# Patient Record
Sex: Female | Born: 1935 | Race: White | Hispanic: No | Smoking: Former smoker
Health system: Southern US, Community
[De-identification: ages and names within clinical notes are randomized; demographics above are authoritative.]

## PROBLEM LIST (undated history)

## (undated) DIAGNOSIS — H547 Unspecified visual loss: Secondary | ICD-10-CM

## (undated) DIAGNOSIS — I509 Heart failure, unspecified: Secondary | ICD-10-CM

## (undated) DIAGNOSIS — K222 Esophageal obstruction: Secondary | ICD-10-CM

## (undated) DIAGNOSIS — J449 Chronic obstructive pulmonary disease, unspecified: Secondary | ICD-10-CM

## (undated) DIAGNOSIS — Z9289 Personal history of other medical treatment: Secondary | ICD-10-CM

## (undated) DIAGNOSIS — M549 Dorsalgia, unspecified: Secondary | ICD-10-CM

## (undated) DIAGNOSIS — I714 Abdominal aortic aneurysm, without rupture, unspecified: Secondary | ICD-10-CM

## (undated) DIAGNOSIS — I6529 Occlusion and stenosis of unspecified carotid artery: Secondary | ICD-10-CM

## (undated) DIAGNOSIS — I341 Nonrheumatic mitral (valve) prolapse: Secondary | ICD-10-CM

## (undated) DIAGNOSIS — I10 Essential (primary) hypertension: Secondary | ICD-10-CM

## (undated) DIAGNOSIS — G8929 Other chronic pain: Secondary | ICD-10-CM

## (undated) DIAGNOSIS — C3490 Malignant neoplasm of unspecified part of unspecified bronchus or lung: Secondary | ICD-10-CM

## (undated) DIAGNOSIS — G4730 Sleep apnea, unspecified: Secondary | ICD-10-CM

## (undated) DIAGNOSIS — F0390 Unspecified dementia without behavioral disturbance: Secondary | ICD-10-CM

## (undated) DIAGNOSIS — I2510 Atherosclerotic heart disease of native coronary artery without angina pectoris: Secondary | ICD-10-CM

## (undated) HISTORY — DX: Occlusion and stenosis of unspecified carotid artery: I65.29

## (undated) HISTORY — DX: Essential (primary) hypertension: I10

## (undated) HISTORY — DX: Personal history of other medical treatment: Z92.89

## (undated) HISTORY — DX: Chronic obstructive pulmonary disease, unspecified: J44.9

## (undated) HISTORY — PX: FINGER SURGERY: SHX640

## (undated) HISTORY — PX: CHOLECYSTECTOMY: SHX55

## (undated) HISTORY — PX: COLONOSCOPY: SHX174

## (undated) HISTORY — PX: APPENDECTOMY: SHX54

## (undated) HISTORY — PX: OTHER SURGICAL HISTORY: SHX169

## (undated) HISTORY — DX: Abdominal aortic aneurysm, without rupture: I71.4

## (undated) HISTORY — DX: Nonrheumatic mitral (valve) prolapse: I34.1

## (undated) HISTORY — DX: Sleep apnea, unspecified: G47.30

## (undated) HISTORY — DX: Heart failure, unspecified: I50.9

## (undated) HISTORY — DX: Malignant neoplasm of unspecified part of unspecified bronchus or lung: C34.90

## (undated) HISTORY — DX: Esophageal obstruction: K22.2

## (undated) HISTORY — DX: Dorsalgia, unspecified: M54.9

## (undated) HISTORY — PX: TOTAL ABDOMINAL HYSTERECTOMY W/ BILATERAL SALPINGOOPHORECTOMY: SHX83

## (undated) HISTORY — PX: BRAIN SURGERY: SHX531

## (undated) HISTORY — DX: Other chronic pain: G89.29

## (undated) HISTORY — DX: Unspecified visual loss: H54.7

## (undated) HISTORY — DX: Atherosclerotic heart disease of native coronary artery without angina pectoris: I25.10

## (undated) HISTORY — DX: Abdominal aortic aneurysm, without rupture, unspecified: I71.40

---

## 2005-03-05 ENCOUNTER — Ambulatory Visit: Payer: Self-pay | Admitting: Ophthalmology

## 2005-03-21 ENCOUNTER — Emergency Department: Payer: Self-pay | Admitting: Emergency Medicine

## 2005-04-09 ENCOUNTER — Ambulatory Visit: Payer: Self-pay | Admitting: Ophthalmology

## 2005-09-28 ENCOUNTER — Emergency Department: Payer: Self-pay | Admitting: Emergency Medicine

## 2005-10-11 ENCOUNTER — Emergency Department: Payer: Self-pay | Admitting: Emergency Medicine

## 2006-03-26 ENCOUNTER — Ambulatory Visit: Payer: Self-pay | Admitting: Family Medicine

## 2007-04-03 ENCOUNTER — Ambulatory Visit: Payer: Self-pay | Admitting: Family Medicine

## 2007-04-03 ENCOUNTER — Ambulatory Visit: Payer: Self-pay | Admitting: Gastroenterology

## 2007-04-03 ENCOUNTER — Other Ambulatory Visit: Payer: Self-pay

## 2007-04-22 ENCOUNTER — Ambulatory Visit: Payer: Self-pay | Admitting: Gastroenterology

## 2008-01-14 ENCOUNTER — Ambulatory Visit: Payer: Self-pay | Admitting: Ophthalmology

## 2008-02-18 ENCOUNTER — Ambulatory Visit: Payer: Self-pay | Admitting: Vascular Surgery

## 2008-03-10 ENCOUNTER — Ambulatory Visit: Payer: Self-pay | Admitting: Internal Medicine

## 2008-03-24 ENCOUNTER — Ambulatory Visit: Payer: Self-pay | Admitting: Otolaryngology

## 2008-03-31 ENCOUNTER — Ambulatory Visit: Payer: Self-pay | Admitting: Otolaryngology

## 2008-05-11 ENCOUNTER — Ambulatory Visit: Payer: Self-pay | Admitting: Family Medicine

## 2008-07-23 ENCOUNTER — Inpatient Hospital Stay: Payer: Self-pay | Admitting: Internal Medicine

## 2008-07-23 ENCOUNTER — Other Ambulatory Visit: Payer: Self-pay

## 2008-09-15 ENCOUNTER — Inpatient Hospital Stay: Payer: Self-pay | Admitting: Internal Medicine

## 2009-01-07 ENCOUNTER — Emergency Department: Payer: Self-pay

## 2009-01-14 ENCOUNTER — Ambulatory Visit: Payer: Self-pay | Admitting: Internal Medicine

## 2009-01-18 ENCOUNTER — Ambulatory Visit: Payer: Self-pay | Admitting: Unknown Physician Specialty

## 2009-01-19 ENCOUNTER — Ambulatory Visit: Payer: Self-pay | Admitting: Unknown Physician Specialty

## 2009-02-25 ENCOUNTER — Ambulatory Visit: Payer: Self-pay | Admitting: Internal Medicine

## 2009-03-25 ENCOUNTER — Emergency Department: Payer: Self-pay | Admitting: Emergency Medicine

## 2009-05-11 ENCOUNTER — Ambulatory Visit: Payer: Self-pay | Admitting: Internal Medicine

## 2009-06-16 ENCOUNTER — Ambulatory Visit: Payer: Self-pay | Admitting: Internal Medicine

## 2009-12-05 ENCOUNTER — Encounter: Payer: Self-pay | Admitting: Internal Medicine

## 2009-12-20 ENCOUNTER — Encounter: Payer: Self-pay | Admitting: Internal Medicine

## 2010-05-21 ENCOUNTER — Observation Stay: Payer: Self-pay | Admitting: Internal Medicine

## 2010-07-06 ENCOUNTER — Ambulatory Visit: Payer: Self-pay | Admitting: Internal Medicine

## 2010-07-12 ENCOUNTER — Ambulatory Visit: Payer: Self-pay | Admitting: Gastroenterology

## 2010-08-05 ENCOUNTER — Inpatient Hospital Stay: Payer: Self-pay | Admitting: Family Medicine

## 2010-08-17 ENCOUNTER — Ambulatory Visit: Payer: Self-pay | Admitting: Gastroenterology

## 2010-08-21 ENCOUNTER — Emergency Department: Payer: Self-pay | Admitting: Emergency Medicine

## 2010-09-07 ENCOUNTER — Inpatient Hospital Stay: Payer: Self-pay | Admitting: Family Medicine

## 2011-01-19 ENCOUNTER — Ambulatory Visit: Payer: Self-pay | Admitting: Internal Medicine

## 2011-02-10 ENCOUNTER — Emergency Department: Payer: Self-pay | Admitting: Emergency Medicine

## 2011-05-31 ENCOUNTER — Inpatient Hospital Stay: Payer: Self-pay

## 2011-07-17 ENCOUNTER — Emergency Department: Payer: Self-pay | Admitting: Emergency Medicine

## 2011-09-26 ENCOUNTER — Inpatient Hospital Stay: Payer: Self-pay | Admitting: Family Medicine

## 2012-01-25 ENCOUNTER — Ambulatory Visit: Payer: Self-pay | Admitting: Internal Medicine

## 2012-03-04 ENCOUNTER — Ambulatory Visit: Payer: Self-pay | Admitting: Family Medicine

## 2012-05-21 ENCOUNTER — Emergency Department: Payer: Self-pay | Admitting: Emergency Medicine

## 2012-11-19 DIAGNOSIS — Z9289 Personal history of other medical treatment: Secondary | ICD-10-CM

## 2012-11-19 HISTORY — DX: Personal history of other medical treatment: Z92.89

## 2013-03-12 ENCOUNTER — Ambulatory Visit: Payer: Self-pay | Admitting: Family Medicine

## 2013-06-16 ENCOUNTER — Emergency Department: Payer: Self-pay | Admitting: Emergency Medicine

## 2013-09-11 ENCOUNTER — Ambulatory Visit: Payer: Self-pay | Admitting: Family Medicine

## 2013-09-14 ENCOUNTER — Ambulatory Visit: Payer: Self-pay | Admitting: Family Medicine

## 2013-10-21 ENCOUNTER — Ambulatory Visit: Payer: Self-pay | Admitting: Gastroenterology

## 2013-10-29 ENCOUNTER — Ambulatory Visit: Payer: Self-pay | Admitting: Gastroenterology

## 2013-11-09 ENCOUNTER — Ambulatory Visit: Payer: Self-pay | Admitting: Surgery

## 2013-11-09 DIAGNOSIS — I10 Essential (primary) hypertension: Secondary | ICD-10-CM

## 2013-11-09 LAB — CBC WITH DIFFERENTIAL/PLATELET
Basophil #: 0 10*3/uL (ref 0.0–0.1)
Eosinophil #: 0.2 10*3/uL (ref 0.0–0.7)
Eosinophil %: 2.6 %
Lymphocyte %: 22.8 %
MCV: 86 fL (ref 80–100)
Monocyte #: 0.7 x10 3/mm (ref 0.2–0.9)
Platelet: 211 10*3/uL (ref 150–440)
RBC: 4.49 10*6/uL (ref 3.80–5.20)

## 2013-11-09 LAB — BASIC METABOLIC PANEL
Anion Gap: 3 — ABNORMAL LOW (ref 7–16)
BUN: 8 mg/dL (ref 7–18)
Chloride: 102 mmol/L (ref 98–107)
Creatinine: 0.71 mg/dL (ref 0.60–1.30)
Glucose: 108 mg/dL — ABNORMAL HIGH (ref 65–99)
Osmolality: 269 (ref 275–301)
Sodium: 135 mmol/L — ABNORMAL LOW (ref 136–145)

## 2013-11-10 LAB — PATHOLOGY REPORT

## 2013-11-20 ENCOUNTER — Ambulatory Visit: Payer: Self-pay | Admitting: Surgery

## 2013-11-23 LAB — PATHOLOGY REPORT

## 2013-12-08 ENCOUNTER — Ambulatory Visit: Payer: Self-pay | Admitting: Family Medicine

## 2013-12-29 ENCOUNTER — Ambulatory Visit: Payer: Self-pay | Admitting: Specialist

## 2014-01-06 ENCOUNTER — Ambulatory Visit: Payer: Self-pay | Admitting: Cardiothoracic Surgery

## 2014-01-07 LAB — COMPREHENSIVE METABOLIC PANEL
Albumin: 3.6 g/dL (ref 3.4–5.0)
Alkaline Phosphatase: 106 U/L
Anion Gap: 10 (ref 7–16)
BUN: 10 mg/dL (ref 7–18)
Bilirubin,Total: 0.2 mg/dL (ref 0.2–1.0)
Calcium, Total: 8.5 mg/dL (ref 8.5–10.1)
Chloride: 100 mmol/L (ref 98–107)
Co2: 27 mmol/L (ref 21–32)
Creatinine: 0.84 mg/dL (ref 0.60–1.30)
EGFR (African American): >60
EGFR (Non-African Amer.): >60
Glucose: 115 mg/dL — ABNORMAL HIGH (ref 65–99)
Osmolality: 274 (ref 275–301)
Potassium: 3.7 mmol/L (ref 3.5–5.1)
SGOT(AST): 20 U/L (ref 15–37)
SGPT (ALT): 19 U/L (ref 12–78)
Sodium: 137 mmol/L (ref 136–145)
Total Protein: 8 g/dL (ref 6.4–8.2)

## 2014-01-07 LAB — CBC CANCER CENTER
BASOS PCT: 0.9 %
Basophil #: 0.1 x10 3/mm (ref 0.0–0.1)
EOS ABS: 0.2 x10 3/mm (ref 0.0–0.7)
Eosinophil %: 3.2 %
HCT: 37.9 % (ref 35.0–47.0)
HGB: 11.9 g/dL — ABNORMAL LOW (ref 12.0–16.0)
LYMPHS ABS: 1.3 x10 3/mm (ref 1.0–3.6)
Lymphocyte %: 24.4 %
MCH: 26.4 pg (ref 26.0–34.0)
MCHC: 31.4 g/dL — ABNORMAL LOW (ref 32.0–36.0)
MCV: 84 fL (ref 80–100)
MONOS PCT: 9.6 %
Monocyte #: 0.5 x10 3/mm (ref 0.2–0.9)
NEUTROS PCT: 61.9 %
Neutrophil #: 3.4 x10 3/mm (ref 1.4–6.5)
PLATELETS: 269 x10 3/mm (ref 150–440)
RBC: 4.51 10*6/uL (ref 3.80–5.20)
RDW: 16.3 % — ABNORMAL HIGH (ref 11.5–14.5)
WBC: 5.5 x10 3/mm (ref 3.6–11.0)

## 2014-01-07 LAB — APTT: Activated PTT: 33.6 secs (ref 23.6–35.9)

## 2014-01-07 LAB — PROTIME-INR
INR: 1.1
Prothrombin Time: 14.1 secs (ref 11.5–14.7)

## 2014-01-15 ENCOUNTER — Ambulatory Visit: Payer: Self-pay | Admitting: Internal Medicine

## 2014-01-17 ENCOUNTER — Ambulatory Visit: Payer: Self-pay | Admitting: Cardiothoracic Surgery

## 2014-01-19 ENCOUNTER — Ambulatory Visit: Payer: Self-pay | Admitting: Cardiothoracic Surgery

## 2014-01-19 LAB — PATHOLOGY REPORT

## 2014-01-30 ENCOUNTER — Emergency Department: Payer: Self-pay | Admitting: Internal Medicine

## 2014-02-01 LAB — CBC CANCER CENTER
Basophil #: 0.1 x10 3/mm (ref 0.0–0.1)
Basophil %: 0.8 %
EOS ABS: 0.1 x10 3/mm (ref 0.0–0.7)
EOS PCT: 2 %
HCT: 37.3 % (ref 35.0–47.0)
HGB: 12.1 g/dL (ref 12.0–16.0)
LYMPHS ABS: 1.3 x10 3/mm (ref 1.0–3.6)
LYMPHS PCT: 19.4 %
MCH: 26.9 pg (ref 26.0–34.0)
MCHC: 32.3 g/dL (ref 32.0–36.0)
MCV: 83 fL (ref 80–100)
MONOS PCT: 7.3 %
Monocyte #: 0.5 x10 3/mm (ref 0.2–0.9)
Neutrophil #: 4.7 x10 3/mm (ref 1.4–6.5)
Neutrophil %: 70.5 %
PLATELETS: 241 x10 3/mm (ref 150–440)
RBC: 4.48 10*6/uL (ref 3.80–5.20)
RDW: 17.2 % — ABNORMAL HIGH (ref 11.5–14.5)
WBC: 6.6 x10 3/mm (ref 3.6–11.0)

## 2014-02-01 LAB — COMPREHENSIVE METABOLIC PANEL
Albumin: 3.7 g/dL (ref 3.4–5.0)
Alkaline Phosphatase: 103 U/L
Anion Gap: 9 (ref 7–16)
BILIRUBIN TOTAL: 0.3 mg/dL (ref 0.2–1.0)
BUN: 14 mg/dL (ref 7–18)
CALCIUM: 9 mg/dL (ref 8.5–10.1)
Chloride: 98 mmol/L (ref 98–107)
Co2: 30 mmol/L (ref 21–32)
Creatinine: 0.82 mg/dL (ref 0.60–1.30)
EGFR (African American): >60
EGFR (Non-African Amer.): >60
Glucose: 109 mg/dL — ABNORMAL HIGH (ref 65–99)
OSMOLALITY: 275 (ref 275–301)
Potassium: 3.7 mmol/L (ref 3.5–5.1)
SGOT(AST): 23 U/L (ref 15–37)
SGPT (ALT): 24 U/L (ref 12–78)
SODIUM: 137 mmol/L (ref 136–145)
TOTAL PROTEIN: 8.3 g/dL — AB (ref 6.4–8.2)

## 2014-02-02 LAB — PATHOLOGY REPORT

## 2014-02-17 ENCOUNTER — Ambulatory Visit: Payer: Self-pay | Admitting: Cardiothoracic Surgery

## 2014-03-09 LAB — CBC CANCER CENTER
BASOS ABS: 0 x10 3/mm (ref 0.0–0.1)
Basophil %: 0.5 %
Eosinophil #: 0.1 x10 3/mm (ref 0.0–0.7)
Eosinophil %: 0.9 %
HCT: 38.2 % (ref 35.0–47.0)
HGB: 12.1 g/dL (ref 12.0–16.0)
LYMPHS ABS: 1.2 x10 3/mm (ref 1.0–3.6)
Lymphocyte %: 19.4 %
MCH: 26.1 pg (ref 26.0–34.0)
MCHC: 31.7 g/dL — ABNORMAL LOW (ref 32.0–36.0)
MCV: 82 fL (ref 80–100)
MONO ABS: 0.6 x10 3/mm (ref 0.2–0.9)
MONOS PCT: 9.6 %
NEUTROS ABS: 4.4 x10 3/mm (ref 1.4–6.5)
Neutrophil %: 69.6 %
PLATELETS: 264 x10 3/mm (ref 150–440)
RBC: 4.63 10*6/uL (ref 3.80–5.20)
RDW: 17 % — AB (ref 11.5–14.5)
WBC: 6.3 x10 3/mm (ref 3.6–11.0)

## 2014-03-09 LAB — COMPREHENSIVE METABOLIC PANEL
ALK PHOS: 96 U/L
ALT: 16 U/L (ref 12–78)
Albumin: 3.4 g/dL (ref 3.4–5.0)
Anion Gap: 9 (ref 7–16)
BUN: 8 mg/dL (ref 7–18)
Bilirubin,Total: 0.5 mg/dL (ref 0.2–1.0)
CALCIUM: 9.7 mg/dL (ref 8.5–10.1)
Chloride: 99 mmol/L (ref 98–107)
Co2: 29 mmol/L (ref 21–32)
Creatinine: 0.8 mg/dL (ref 0.60–1.30)
EGFR (African American): >60
GLUCOSE: 106 mg/dL — AB (ref 65–99)
Osmolality: 273 (ref 275–301)
Potassium: 3.3 mmol/L — ABNORMAL LOW (ref 3.5–5.1)
SGOT(AST): 13 U/L — ABNORMAL LOW (ref 15–37)
Sodium: 137 mmol/L (ref 136–145)
TOTAL PROTEIN: 8.3 g/dL — AB (ref 6.4–8.2)

## 2014-03-19 ENCOUNTER — Ambulatory Visit: Payer: Self-pay | Admitting: Radiation Oncology

## 2014-03-19 ENCOUNTER — Ambulatory Visit: Payer: Self-pay | Admitting: Cardiothoracic Surgery

## 2014-03-31 LAB — CBC CANCER CENTER
BASOS ABS: 0 x10 3/mm (ref 0.0–0.1)
BASOS PCT: 0.6 %
EOS PCT: 2.2 %
Eosinophil #: 0.1 x10 3/mm (ref 0.0–0.7)
HCT: 35.5 % (ref 35.0–47.0)
HGB: 11.2 g/dL — ABNORMAL LOW (ref 12.0–16.0)
Lymphocyte #: 1.1 x10 3/mm (ref 1.0–3.6)
Lymphocyte %: 18.6 %
MCH: 26.4 pg (ref 26.0–34.0)
MCHC: 31.6 g/dL — ABNORMAL LOW (ref 32.0–36.0)
MCV: 83 fL (ref 80–100)
Monocyte #: 0.6 x10 3/mm (ref 0.2–0.9)
Monocyte %: 10.7 %
NEUTROS ABS: 3.9 x10 3/mm (ref 1.4–6.5)
Neutrophil %: 67.9 %
PLATELETS: 213 x10 3/mm (ref 150–440)
RBC: 4.25 10*6/uL (ref 3.80–5.20)
RDW: 17.5 % — ABNORMAL HIGH (ref 11.5–14.5)
WBC: 5.7 x10 3/mm (ref 3.6–11.0)

## 2014-04-19 ENCOUNTER — Ambulatory Visit: Payer: Self-pay | Admitting: Hematology and Oncology

## 2014-05-14 ENCOUNTER — Emergency Department: Payer: Self-pay | Admitting: Emergency Medicine

## 2014-05-14 LAB — CBC
HCT: 35.2 % (ref 35.0–47.0)
HGB: 11.5 g/dL — AB (ref 12.0–16.0)
MCH: 28.1 pg (ref 26.0–34.0)
MCHC: 32.6 g/dL (ref 32.0–36.0)
MCV: 86 fL (ref 80–100)
PLATELETS: 173 10*3/uL (ref 150–440)
RBC: 4.09 10*6/uL (ref 3.80–5.20)
RDW: 18.5 % — ABNORMAL HIGH (ref 11.5–14.5)
WBC: 5.5 10*3/uL (ref 3.6–11.0)

## 2014-05-14 LAB — COMPREHENSIVE METABOLIC PANEL
ALBUMIN: 3.5 g/dL (ref 3.4–5.0)
ALK PHOS: 92 U/L
Anion Gap: 7 (ref 7–16)
BILIRUBIN TOTAL: 0.2 mg/dL (ref 0.2–1.0)
BUN: 7 mg/dL (ref 7–18)
CALCIUM: 9.1 mg/dL (ref 8.5–10.1)
CREATININE: 0.71 mg/dL (ref 0.60–1.30)
Chloride: 103 mmol/L (ref 98–107)
Co2: 27 mmol/L (ref 21–32)
EGFR (African American): >60
Glucose: 116 mg/dL — ABNORMAL HIGH (ref 65–99)
Osmolality: 273 (ref 275–301)
POTASSIUM: 3.3 mmol/L — AB (ref 3.5–5.1)
SGOT(AST): 15 U/L (ref 15–37)
SGPT (ALT): 16 U/L (ref 12–78)
SODIUM: 137 mmol/L (ref 136–145)
TOTAL PROTEIN: 7.2 g/dL (ref 6.4–8.2)

## 2014-05-14 LAB — PROTIME-INR
INR: 1.2
Prothrombin Time: 14.7 secs (ref 11.5–14.7)

## 2014-05-14 LAB — CK TOTAL AND CKMB (NOT AT ARMC)
CK, TOTAL: 115 U/L
CK-MB: 3.2 ng/mL (ref 0.5–3.6)

## 2014-05-14 LAB — TROPONIN I

## 2014-05-14 LAB — APTT: ACTIVATED PTT: 31 s (ref 23.6–35.9)

## 2014-05-19 ENCOUNTER — Ambulatory Visit: Payer: Self-pay | Admitting: Hematology and Oncology

## 2014-06-24 ENCOUNTER — Ambulatory Visit: Payer: Self-pay | Admitting: Vascular Surgery

## 2014-07-02 ENCOUNTER — Ambulatory Visit: Payer: Self-pay | Admitting: Internal Medicine

## 2014-07-02 LAB — CBC CANCER CENTER
Basophil #: 0 x10 3/mm (ref 0.0–0.1)
Basophil %: 0.6 %
Eosinophil #: 0.1 x10 3/mm (ref 0.0–0.7)
Eosinophil %: 2.7 %
HCT: 39.3 % (ref 35.0–47.0)
HGB: 12.7 g/dL (ref 12.0–16.0)
Lymphocyte #: 1 x10 3/mm (ref 1.0–3.6)
Lymphocyte %: 19.4 %
MCH: 28.6 pg (ref 26.0–34.0)
MCHC: 32.3 g/dL (ref 32.0–36.0)
MCV: 89 fL (ref 80–100)
Monocyte #: 0.6 x10 3/mm (ref 0.2–0.9)
Monocyte %: 11.7 %
Neutrophil #: 3.3 x10 3/mm (ref 1.4–6.5)
Neutrophil %: 65.6 %
Platelet: 202 x10 3/mm (ref 150–440)
RBC: 4.43 10*6/uL (ref 3.80–5.20)
RDW: 17 % — ABNORMAL HIGH (ref 11.5–14.5)
WBC: 5 x10 3/mm (ref 3.6–11.0)

## 2014-07-09 ENCOUNTER — Emergency Department: Payer: Self-pay | Admitting: Emergency Medicine

## 2014-07-09 LAB — CBC WITH DIFFERENTIAL/PLATELET
Basophil #: 0 10*3/uL (ref 0.0–0.1)
Basophil %: 0.7 %
EOS PCT: 1.6 %
Eosinophil #: 0.1 10*3/uL (ref 0.0–0.7)
HCT: 37.5 % (ref 35.0–47.0)
HGB: 12 g/dL (ref 12.0–16.0)
Lymphocyte #: 0.8 10*3/uL — ABNORMAL LOW (ref 1.0–3.6)
Lymphocyte %: 15.6 %
MCH: 28.8 pg (ref 26.0–34.0)
MCHC: 32.1 g/dL (ref 32.0–36.0)
MCV: 90 fL (ref 80–100)
Monocyte #: 0.5 x10 3/mm (ref 0.2–0.9)
Monocyte %: 9.7 %
Neutrophil #: 3.5 10*3/uL (ref 1.4–6.5)
Neutrophil %: 72.4 %
PLATELETS: 173 10*3/uL (ref 150–440)
RBC: 4.18 10*6/uL (ref 3.80–5.20)
RDW: 16.8 % — ABNORMAL HIGH (ref 11.5–14.5)
WBC: 4.9 10*3/uL (ref 3.6–11.0)

## 2014-07-09 LAB — COMPREHENSIVE METABOLIC PANEL
ANION GAP: 8 (ref 7–16)
AST: 22 U/L (ref 15–37)
Albumin: 3.4 g/dL (ref 3.4–5.0)
Alkaline Phosphatase: 91 U/L
BUN: 9 mg/dL (ref 7–18)
Bilirubin,Total: 0.3 mg/dL (ref 0.2–1.0)
CO2: 24 mmol/L (ref 21–32)
Calcium, Total: 8.6 mg/dL (ref 8.5–10.1)
Chloride: 111 mmol/L — ABNORMAL HIGH (ref 98–107)
Creatinine: 0.78 mg/dL (ref 0.60–1.30)
EGFR (African American): >60
Glucose: 130 mg/dL — ABNORMAL HIGH (ref 65–99)
OSMOLALITY: 285 (ref 275–301)
POTASSIUM: 3.3 mmol/L — AB (ref 3.5–5.1)
SGPT (ALT): 13 U/L — ABNORMAL LOW
SODIUM: 143 mmol/L (ref 136–145)
Total Protein: 7.3 g/dL (ref 6.4–8.2)

## 2014-07-09 LAB — LIPASE, BLOOD: LIPASE: 84 U/L (ref 73–393)

## 2014-07-20 ENCOUNTER — Ambulatory Visit: Payer: Self-pay | Admitting: Internal Medicine

## 2014-07-29 ENCOUNTER — Telehealth: Payer: Self-pay | Admitting: *Deleted

## 2014-07-29 ENCOUNTER — Encounter: Payer: Self-pay | Admitting: *Deleted

## 2014-07-29 NOTE — Telephone Encounter (Signed)
Called left vm message to call with appt.  I left my name and phone number

## 2014-07-29 NOTE — Progress Notes (Signed)
Called [facility] to obtain pt phone number.

## 2014-07-30 ENCOUNTER — Telehealth: Payer: Self-pay | Admitting: *Deleted

## 2014-07-30 NOTE — Telephone Encounter (Signed)
Called patient with appt with Dr. Julien Nordmann on 08/10/14 at 11:15.  She verbalized understanding of appt time and place.

## 2014-08-03 ENCOUNTER — Emergency Department: Payer: Self-pay | Admitting: Emergency Medicine

## 2014-08-03 LAB — CBC
HCT: 36.8 % (ref 35.0–47.0)
HGB: 11.8 g/dL — AB (ref 12.0–16.0)
MCH: 28.8 pg (ref 26.0–34.0)
MCHC: 32 g/dL (ref 32.0–36.0)
MCV: 90 fL (ref 80–100)
Platelet: 210 10*3/uL (ref 150–440)
RBC: 4.08 10*6/uL (ref 3.80–5.20)
RDW: 15.8 % — AB (ref 11.5–14.5)
WBC: 5.7 10*3/uL (ref 3.6–11.0)

## 2014-08-03 LAB — BASIC METABOLIC PANEL
Anion Gap: 4 — ABNORMAL LOW (ref 7–16)
BUN: 10 mg/dL (ref 7–18)
CALCIUM: 8.8 mg/dL (ref 8.5–10.1)
CHLORIDE: 104 mmol/L (ref 98–107)
CREATININE: 0.48 mg/dL — AB (ref 0.60–1.30)
Co2: 31 mmol/L (ref 21–32)
EGFR (African American): >60
EGFR (Non-African Amer.): >60
GLUCOSE: 102 mg/dL — AB (ref 65–99)
OSMOLALITY: 277 (ref 275–301)
Potassium: 3.9 mmol/L (ref 3.5–5.1)
Sodium: 139 mmol/L (ref 136–145)

## 2014-08-03 LAB — TROPONIN I: Troponin-I: <0.02 ng/mL

## 2014-08-10 ENCOUNTER — Emergency Department: Payer: Self-pay | Admitting: Emergency Medicine

## 2014-08-10 ENCOUNTER — Ambulatory Visit: Payer: Self-pay | Admitting: Internal Medicine

## 2014-08-10 ENCOUNTER — Other Ambulatory Visit: Payer: Self-pay

## 2014-08-10 ENCOUNTER — Other Ambulatory Visit: Payer: Self-pay | Admitting: *Deleted

## 2014-08-10 ENCOUNTER — Other Ambulatory Visit: Payer: Self-pay | Admitting: Internal Medicine

## 2014-08-10 DIAGNOSIS — C3490 Malignant neoplasm of unspecified part of unspecified bronchus or lung: Secondary | ICD-10-CM | POA: Insufficient documentation

## 2014-08-10 DIAGNOSIS — R918 Other nonspecific abnormal finding of lung field: Secondary | ICD-10-CM

## 2014-08-10 DIAGNOSIS — C3410 Malignant neoplasm of upper lobe, unspecified bronchus or lung: Secondary | ICD-10-CM

## 2014-08-10 LAB — BASIC METABOLIC PANEL
Anion Gap: 6 — ABNORMAL LOW (ref 7–16)
BUN: 8 mg/dL (ref 7–18)
CALCIUM: 8.5 mg/dL (ref 8.5–10.1)
CO2: 30 mmol/L (ref 21–32)
CREATININE: 0.7 mg/dL (ref 0.60–1.30)
Chloride: 105 mmol/L (ref 98–107)
EGFR (African American): >60
EGFR (Non-African Amer.): >60
Glucose: 92 mg/dL (ref 65–99)
Osmolality: 279 (ref 275–301)
Potassium: 3 mmol/L — ABNORMAL LOW (ref 3.5–5.1)
Sodium: 141 mmol/L (ref 136–145)

## 2014-08-10 LAB — LIPASE, BLOOD: Lipase: 158 U/L (ref 73–393)

## 2014-08-10 LAB — HEPATIC FUNCTION PANEL A (ARMC)
ALK PHOS: 87 U/L
ALT: 19 U/L
Albumin: 3.2 g/dL — ABNORMAL LOW (ref 3.4–5.0)
Bilirubin, Direct: <0.1 mg/dL (ref 0.00–0.20)
Bilirubin,Total: 0.2 mg/dL (ref 0.2–1.0)
SGOT(AST): 20 U/L (ref 15–37)
TOTAL PROTEIN: 7 g/dL (ref 6.4–8.2)

## 2014-08-10 LAB — CBC
HCT: 35.5 % (ref 35.0–47.0)
HGB: 11.2 g/dL — ABNORMAL LOW (ref 12.0–16.0)
MCH: 29 pg (ref 26.0–34.0)
MCHC: 31.7 g/dL — AB (ref 32.0–36.0)
MCV: 92 fL (ref 80–100)
Platelet: 190 10*3/uL (ref 150–440)
RBC: 3.87 10*6/uL (ref 3.80–5.20)
RDW: 15.7 % — AB (ref 11.5–14.5)
WBC: 7.5 10*3/uL (ref 3.6–11.0)

## 2014-08-10 LAB — TROPONIN I: Troponin-I: <0.02 ng/mL

## 2014-09-03 ENCOUNTER — Ambulatory Visit: Payer: Self-pay | Admitting: Internal Medicine

## 2014-09-06 ENCOUNTER — Inpatient Hospital Stay: Payer: Self-pay | Admitting: Family Medicine

## 2014-09-06 LAB — CBC
HCT: 36.4 % (ref 35.0–47.0)
HGB: 12 g/dL (ref 12.0–16.0)
MCH: 29.6 pg (ref 26.0–34.0)
MCHC: 32.9 g/dL (ref 32.0–36.0)
MCV: 90 fL (ref 80–100)
PLATELETS: 221 10*3/uL (ref 150–440)
RBC: 4.04 10*6/uL (ref 3.80–5.20)
RDW: 15.4 % — AB (ref 11.5–14.5)
WBC: 6.3 10*3/uL (ref 3.6–11.0)

## 2014-09-06 LAB — TROPONIN I
Troponin-I: <0.02 ng/mL
Troponin-I: <0.02 ng/mL

## 2014-09-06 LAB — BASIC METABOLIC PANEL
ANION GAP: 8 (ref 7–16)
BUN: 5 mg/dL — AB (ref 7–18)
CHLORIDE: 100 mmol/L (ref 98–107)
CO2: 31 mmol/L (ref 21–32)
CREATININE: 0.77 mg/dL (ref 0.60–1.30)
Calcium, Total: 8.5 mg/dL (ref 8.5–10.1)
Glucose: 119 mg/dL — ABNORMAL HIGH (ref 65–99)
OSMOLALITY: 276 (ref 275–301)
Potassium: 3.2 mmol/L — ABNORMAL LOW (ref 3.5–5.1)
SODIUM: 139 mmol/L (ref 136–145)

## 2014-09-06 LAB — PROTIME-INR
INR: 1.1
Prothrombin Time: 14.3 secs (ref 11.5–14.7)

## 2014-09-06 LAB — PRO B NATRIURETIC PEPTIDE: B-Type Natriuretic Peptide: 179 pg/mL (ref 0–450)

## 2014-09-19 ENCOUNTER — Ambulatory Visit: Payer: Self-pay | Admitting: Internal Medicine

## 2014-10-04 ENCOUNTER — Emergency Department: Payer: Self-pay | Admitting: Emergency Medicine

## 2014-10-04 LAB — CBC WITH DIFFERENTIAL/PLATELET
Basophil #: 0 10*3/uL (ref 0.0–0.1)
Basophil %: 0.5 %
EOS ABS: 0.3 10*3/uL (ref 0.0–0.7)
EOS PCT: 7.6 %
HCT: 34.5 % — ABNORMAL LOW (ref 35.0–47.0)
HGB: 11 g/dL — ABNORMAL LOW (ref 12.0–16.0)
LYMPHS ABS: 0.7 10*3/uL — AB (ref 1.0–3.6)
Lymphocyte %: 17.6 %
MCH: 28.9 pg (ref 26.0–34.0)
MCHC: 32 g/dL (ref 32.0–36.0)
MCV: 90 fL (ref 80–100)
Monocyte #: 0.5 x10 3/mm (ref 0.2–0.9)
Monocyte %: 12 %
NEUTROS ABS: 2.5 10*3/uL (ref 1.4–6.5)
NEUTROS PCT: 62.3 %
Platelet: 263 10*3/uL (ref 150–440)
RBC: 3.82 10*6/uL (ref 3.80–5.20)
RDW: 15.8 % — ABNORMAL HIGH (ref 11.5–14.5)
WBC: 4.1 10*3/uL (ref 3.6–11.0)

## 2014-10-04 LAB — COMPREHENSIVE METABOLIC PANEL
ALBUMIN: 3.2 g/dL — AB (ref 3.4–5.0)
ALK PHOS: 96 U/L
ANION GAP: 6 — AB (ref 7–16)
AST: 15 U/L (ref 15–37)
BUN: 10 mg/dL (ref 7–18)
Bilirubin,Total: 0.2 mg/dL (ref 0.2–1.0)
CREATININE: 0.87 mg/dL (ref 0.60–1.30)
Calcium, Total: 8.2 mg/dL — ABNORMAL LOW (ref 8.5–10.1)
Chloride: 102 mmol/L (ref 98–107)
Co2: 30 mmol/L (ref 21–32)
EGFR (African American): >60
GLUCOSE: 107 mg/dL — AB (ref 65–99)
Osmolality: 275 (ref 275–301)
POTASSIUM: 2.9 mmol/L — AB (ref 3.5–5.1)
SGPT (ALT): 17 U/L
Sodium: 138 mmol/L (ref 136–145)
TOTAL PROTEIN: 7.3 g/dL (ref 6.4–8.2)

## 2014-10-21 ENCOUNTER — Emergency Department: Payer: Self-pay | Admitting: Internal Medicine

## 2014-10-21 LAB — COMPREHENSIVE METABOLIC PANEL
ANION GAP: 7 (ref 7–16)
Albumin: 3.2 g/dL — ABNORMAL LOW (ref 3.4–5.0)
Alkaline Phosphatase: 84 U/L
BUN: 6 mg/dL — ABNORMAL LOW (ref 7–18)
Bilirubin,Total: 0.3 mg/dL (ref 0.2–1.0)
CALCIUM: 8.7 mg/dL (ref 8.5–10.1)
Chloride: 104 mmol/L (ref 98–107)
Co2: 28 mmol/L (ref 21–32)
Creatinine: 0.82 mg/dL (ref 0.60–1.30)
EGFR (African American): >60
Glucose: 99 mg/dL (ref 65–99)
Osmolality: 275 (ref 275–301)
Potassium: 3.7 mmol/L (ref 3.5–5.1)
SGOT(AST): 15 U/L (ref 15–37)
SGPT (ALT): 15 U/L
Sodium: 139 mmol/L (ref 136–145)
Total Protein: 7.4 g/dL (ref 6.4–8.2)

## 2014-10-21 LAB — CK TOTAL AND CKMB (NOT AT ARMC)
CK, TOTAL: 80 U/L (ref 26–192)
CK-MB: 1.7 ng/mL (ref 0.5–3.6)

## 2014-10-21 LAB — CBC
HCT: 35.3 % (ref 35.0–47.0)
HGB: 11 g/dL — ABNORMAL LOW (ref 12.0–16.0)
MCH: 28.1 pg (ref 26.0–34.0)
MCHC: 31.3 g/dL — ABNORMAL LOW (ref 32.0–36.0)
MCV: 90 fL (ref 80–100)
PLATELETS: 207 10*3/uL (ref 150–440)
RBC: 3.93 10*6/uL (ref 3.80–5.20)
RDW: 16.4 % — ABNORMAL HIGH (ref 11.5–14.5)
WBC: 4.7 10*3/uL (ref 3.6–11.0)

## 2014-10-21 LAB — TROPONIN I

## 2014-10-21 LAB — PROTIME-INR
INR: 1.2
PROTHROMBIN TIME: 15 s — AB (ref 11.5–14.7)

## 2014-10-21 LAB — PRO B NATRIURETIC PEPTIDE: B-Type Natriuretic Peptide: 148 pg/mL (ref 0–450)

## 2014-11-05 ENCOUNTER — Ambulatory Visit: Payer: Self-pay | Admitting: Internal Medicine

## 2014-11-09 ENCOUNTER — Ambulatory Visit: Payer: Self-pay | Admitting: Internal Medicine

## 2014-11-11 ENCOUNTER — Emergency Department: Payer: Self-pay | Admitting: Emergency Medicine

## 2014-11-11 LAB — COMPREHENSIVE METABOLIC PANEL
ALK PHOS: 99 U/L
ANION GAP: 13 (ref 7–16)
Albumin: 3.4 g/dL (ref 3.4–5.0)
BUN: 5 mg/dL — ABNORMAL LOW (ref 7–18)
Bilirubin,Total: 0.6 mg/dL (ref 0.2–1.0)
CO2: 25 mmol/L (ref 21–32)
Calcium, Total: 8.8 mg/dL (ref 8.5–10.1)
Chloride: 97 mmol/L — ABNORMAL LOW (ref 98–107)
Creatinine: 0.75 mg/dL (ref 0.60–1.30)
EGFR (African American): >60
EGFR (Non-African Amer.): >60
GLUCOSE: 121 mg/dL — AB (ref 65–99)
Osmolality: 269 (ref 275–301)
POTASSIUM: 2.9 mmol/L — AB (ref 3.5–5.1)
SGOT(AST): 32 U/L (ref 15–37)
SGPT (ALT): 14 U/L
Sodium: 135 mmol/L — ABNORMAL LOW (ref 136–145)
Total Protein: 8.1 g/dL (ref 6.4–8.2)

## 2014-11-11 LAB — CBC
HCT: 39.8 % (ref 35.0–47.0)
HGB: 12.7 g/dL (ref 12.0–16.0)
MCH: 27.2 pg (ref 26.0–34.0)
MCHC: 31.9 g/dL — ABNORMAL LOW (ref 32.0–36.0)
MCV: 85 fL (ref 80–100)
PLATELETS: 276 10*3/uL (ref 150–440)
RBC: 4.67 10*6/uL (ref 3.80–5.20)
RDW: 16.6 % — ABNORMAL HIGH (ref 11.5–14.5)
WBC: 6.2 10*3/uL (ref 3.6–11.0)

## 2014-11-11 LAB — TROPONIN I: Troponin-I: <0.02 ng/mL

## 2014-11-19 ENCOUNTER — Ambulatory Visit: Payer: Self-pay | Admitting: Internal Medicine

## 2014-12-03 ENCOUNTER — Emergency Department: Payer: Self-pay | Admitting: Student

## 2014-12-03 LAB — CBC
HCT: 36.4 % (ref 35.0–47.0)
HGB: 11.7 g/dL — AB (ref 12.0–16.0)
MCH: 27.2 pg (ref 26.0–34.0)
MCHC: 32.2 g/dL (ref 32.0–36.0)
MCV: 85 fL (ref 80–100)
Platelet: 217 10*3/uL (ref 150–440)
RBC: 4.3 10*6/uL (ref 3.80–5.20)
RDW: 16.5 % — ABNORMAL HIGH (ref 11.5–14.5)
WBC: 5.1 10*3/uL (ref 3.6–11.0)

## 2014-12-03 LAB — COMPREHENSIVE METABOLIC PANEL
ALK PHOS: 87 U/L
Albumin: 3.4 g/dL (ref 3.4–5.0)
Anion Gap: 8 (ref 7–16)
BILIRUBIN TOTAL: 0.5 mg/dL (ref 0.2–1.0)
BUN: 3 mg/dL — ABNORMAL LOW (ref 7–18)
CO2: 26 mmol/L (ref 21–32)
CREATININE: 0.76 mg/dL (ref 0.60–1.30)
Calcium, Total: 8.9 mg/dL (ref 8.5–10.1)
Chloride: 102 mmol/L (ref 98–107)
EGFR (African American): >60
EGFR (Non-African Amer.): >60
Glucose: 119 mg/dL — ABNORMAL HIGH (ref 65–99)
OSMOLALITY: 270 (ref 275–301)
Potassium: 2.7 mmol/L — ABNORMAL LOW (ref 3.5–5.1)
SGOT(AST): 18 U/L (ref 15–37)
SGPT (ALT): 18 U/L
Sodium: 136 mmol/L (ref 136–145)
TOTAL PROTEIN: 7.5 g/dL (ref 6.4–8.2)

## 2014-12-03 LAB — LIPASE, BLOOD: Lipase: 65 U/L — ABNORMAL LOW (ref 73–393)

## 2014-12-03 LAB — TROPONIN I

## 2014-12-06 ENCOUNTER — Emergency Department: Payer: Self-pay | Admitting: Emergency Medicine

## 2014-12-06 LAB — COMPREHENSIVE METABOLIC PANEL
ALK PHOS: 86 U/L
AST: 22 U/L (ref 15–37)
Albumin: 3.3 g/dL — ABNORMAL LOW (ref 3.4–5.0)
Anion Gap: 10 (ref 7–16)
BILIRUBIN TOTAL: 0.5 mg/dL (ref 0.2–1.0)
BUN: 4 mg/dL — AB (ref 7–18)
CHLORIDE: 105 mmol/L (ref 98–107)
Calcium, Total: 9 mg/dL (ref 8.5–10.1)
Co2: 24 mmol/L (ref 21–32)
Creatinine: 0.73 mg/dL (ref 0.60–1.30)
EGFR (African American): >60
EGFR (Non-African Amer.): >60
GLUCOSE: 114 mg/dL — AB (ref 65–99)
OSMOLALITY: 275 (ref 275–301)
Potassium: 3.5 mmol/L (ref 3.5–5.1)
SGPT (ALT): 20 U/L
Sodium: 139 mmol/L (ref 136–145)
TOTAL PROTEIN: 7.3 g/dL (ref 6.4–8.2)

## 2014-12-06 LAB — CBC WITH DIFFERENTIAL/PLATELET
BASOS ABS: 0 10*3/uL (ref 0.0–0.1)
Basophil %: 0.3 %
EOS ABS: 0.1 10*3/uL (ref 0.0–0.7)
Eosinophil %: 3.3 %
HCT: 37.5 % (ref 35.0–47.0)
HGB: 11.9 g/dL — ABNORMAL LOW (ref 12.0–16.0)
LYMPHS PCT: 16.2 %
Lymphocyte #: 0.7 10*3/uL — ABNORMAL LOW (ref 1.0–3.6)
MCH: 26.6 pg (ref 26.0–34.0)
MCHC: 31.6 g/dL — ABNORMAL LOW (ref 32.0–36.0)
MCV: 84 fL (ref 80–100)
Monocyte #: 0.5 x10 3/mm (ref 0.2–0.9)
Monocyte %: 11.5 %
NEUTROS PCT: 68.7 %
Neutrophil #: 2.8 10*3/uL (ref 1.4–6.5)
Platelet: 188 10*3/uL (ref 150–440)
RBC: 4.45 10*6/uL (ref 3.80–5.20)
RDW: 16.6 % — AB (ref 11.5–14.5)
WBC: 4.1 10*3/uL (ref 3.6–11.0)

## 2014-12-08 DIAGNOSIS — J449 Chronic obstructive pulmonary disease, unspecified: Secondary | ICD-10-CM | POA: Insufficient documentation

## 2014-12-20 ENCOUNTER — Ambulatory Visit: Payer: Self-pay | Admitting: Internal Medicine

## 2014-12-27 ENCOUNTER — Emergency Department: Payer: Self-pay | Admitting: Emergency Medicine

## 2015-01-18 ENCOUNTER — Ambulatory Visit
Admit: 2015-01-18 | Disposition: A | Discharge status: Home / Self Care | Payer: Self-pay | Attending: Internal Medicine | Admitting: Internal Medicine

## 2015-01-20 ENCOUNTER — Inpatient Hospital Stay: Payer: Self-pay | Admitting: Internal Medicine

## 2015-01-21 ENCOUNTER — Ambulatory Visit: Payer: Self-pay | Admitting: Neurology

## 2015-01-22 DIAGNOSIS — I509 Heart failure, unspecified: Secondary | ICD-10-CM

## 2015-02-16 ENCOUNTER — Ambulatory Visit
Admit: 2015-02-16 | Disposition: A | Discharge status: Home / Self Care | Payer: Self-pay | Attending: Internal Medicine | Admitting: Internal Medicine

## 2015-02-18 ENCOUNTER — Ambulatory Visit
Admit: 2015-02-18 | Disposition: A | Discharge status: Home / Self Care | Payer: Self-pay | Attending: Internal Medicine | Admitting: Internal Medicine

## 2015-03-12 NOTE — Op Note (Signed)
PATIENT NAME:  Gail Swanson, Gail Swanson MR#:  [id number] DATE OF BIRTH:  1932-05-20  DATE OF PROCEDURE:  11/20/2013  PREOPERATIVE DIAGNOSEIS: Chronic acalculous cholecystitis, umbilical hernia.   POSTOPERATIVE DIAGNOSES: Chronic acalculous cholecystitis, umbilical hernia.  PROCEDURES PERFORMED: Laparoscopic cholecystectomy, cholangiogram, umbilical hernia repair.   SURGEON: Rochel Brome, M.D.   ANESTHESIA: General.   INDICATIONS: This 81 year old female has a history of epigastric pains of several months' duration. She describes intermittent epigastric pains, which have been fairly severe, sometimes go around to her back. She had upper endoscopy with findings of minimal evidence of gastritis. She had an ultrasound which demonstrated sludge within her gallbladder. Hepatobiliary scan showed a 0% ejection fraction with ingestion of 8 ounces of Ensure. CT scan showed some mild thickening in the rectosigmoid area and also a fat-filled umbilical hernia.   Physical exam was notable for obesity, umbilical hernia and mild right upper quadrant tenderness. She reported that she has been having some discomfort with her umbilical hernia and requesting repair, and discussed repair of the umbilical hernia at the same operative session.    The patient was placed on the operating table in the supine position under general anesthesia. The abdomen was prepared with ChloraPrep, draped in a sterile manner.   An approximately 2.8 cm bulge was seen at the umbilicus. An infraumbilical transversely oriented curvilinear incision was made, carried down through a thin layer of subcutaneous tissue to encounter an umbilical hernia sac, which was dissected free from surrounding structures and dissected up to the fascial ring defect and separated from the fascial ring defect. The sac was excised. Omentum, which had been within the sac, had been reduced. The remainder of the right umbilical hernia repair was done at the end of the procedure.    The hook was used to elevate the fascia. A Veress needle was inserted; aspirated and irrigated with a saline solution. Next, the peritoneal cavity was inflated with carbon dioxide. The Veress needle was removed. The 10 mm cannula was inserted. The 10 mm, 0-degree laparoscope was inserted to view the peritoneal cavity. It is noted the patient was very obese, and there was some evidence of fatty infiltration of the liver. The patient was placed in the reverse Trendelenburg position, turned several degrees to the left. Another incision was made in the epigastrium, slightly to the right of the midline, to introduce an 11 mm cannula. Two incisions were made in the lateral aspect of the right upper quadrant to introduce two 5 mm cannulas. Next, the gallbladder was retracted towards the right shoulder. There were multiple adhesions between the omentum and the gallbladder, which were taken down with blunt and sharp dissection and also used  electrocautery. The infundibulum was retracted inferiorly and laterally. The location of the porta hepatis was demonstrated. Multiple additional adhesions were taken down exposing the cystic duct, which was dissected free from surrounding structures and appeared to be approximately 7 mm in diameter. The cystic artery was dissected free from the surrounding structures. The neck of the gallbladder was dissected free from the liver with use of hook and cautery. A critical view of safety was demonstrated. An endoclip was placed across the cystic duct adjacent to the neck of the gallbladder. An incision was made in the cystic duct to introduce a Reddick catheter. Half-strength Conray-60 dye was injected as the cholangiogram was done with fluoroscopy. There was prompt flow of dye into the duodenum. No retained stones were seen. The cholangiogram appeared normal. The Reddick catheter was removed. The cystic  duct was doubly ligated with endoclips and divided. The cystic artery was controlled  with double endoclips and divided. The gallbladder was dissected free from the liver with hook and cautery. The gallbladder was completely separated. The site was irrigated with heparinized saline solution and aspirated. The gallbladder was delivered up through the infraumbilical incision and submitted in formalin for routine pathology. It is noted that during the course of the procedure, there was some leakage of bile out of the gallbladder and 2 endoclips were placed at that site.   Subsequently, the right upper quadrant was further inspected, irrigated and aspirated. Hemostasis was intact. Next, the cannulas were removed. Carbon dioxide was allowed to escape from the peritoneal cavity. The fascial defect at the umbilicus was closed with 0 Maxon figure-of-eight suture plus 1 other simple suture to repair the umbilical hernia. Next, the skin of the umbilicus was tacked to the deep fascia and subcutaneous tissues with two 3-0 chromic sutures. Next, all incisions were closed with interrupted 5-0 chromic subcuticular suture, benzoin and Steri-Strips. Dressings were applied with paper tape. The patient tolerated the procedure satisfactorily and was then prepared for transfer to the recovery room.     ____________________________ Lenna Sciara. Rochel Brome, MD jws:dmm D: 11/20/2013 09:36:40 ET T: 11/20/2013 09:51:25 ET JOB#: [id number]  cc: Loreli Dollar, MD, <Dictator> Loreli Dollar MD ELECTRONICALLY SIGNED 11/26/2013 19:49
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
## 2015-03-12 NOTE — Discharge Summary (Signed)
PATIENT NAME:  Gail Swanson, Gail Swanson MR#:  [id number] DATE OF BIRTH:  08-Oct-1932  DATE OF ADMISSION:  09/05/2014 DATE OF DISCHARGE:  09/06/2014   DISCHARGE DIAGNOSES:   Chronic obstructive pulmonary disease exacerbation acute on chronic.   DISCHARGE MEDICATIONS:  1.  Prednisone taper as directed.  2.  Levaquin 250 mg p.o. daily x 9 more days.  3.  Advair 250/50 at 1 puff b.i.d.  4.  Albuterol nebulizers as directed.  5.  Buspirone 10 mg p.o. b.i.d.  6.  Fluticasone nasal 50 mcg 2 sprays each nostril daily as needed for rhinitis.  7.  Furosemide 40 mg p.o. b.i.d.  8.  Albuterol ipratropium inhaler q.i.d. as needed for shortness of breath.  9.  Spiriva 18 mcg p.o. daily.  10. Topiramate 25 mg p.o. at bedtime.  11. Fluoxetine 10 mg p.o. daily.  12. Gabapentin 600 mg p.o. t.i.d.  13. Pantoprazole 40 mg p.o. daily half jour prior to breakfast.   CONSULTS: None.   PROCEDURES: None.   PERTINENT LABORATORY AND STUDIES:  Chest x-ray showed no acute changes to the apical mass in the right lobe. No consolidation seen.  White blood cell count 6.3, hemoglobin 12, platelets 221,000. sodium 139, potassium 3.2, CO2 of 31, creatinine 0.77.  The patient was saturating 96% on 2 liters.   BRIEF HOSPITAL COURSE:  Acute on chronic COPD exacerbation. The patient was initially admitted with hypoxemia, improved with O2.  She is on chronic O2 at home, but she had progressive worsening cough and sputum production consistent with a COPD exacerbation. Chest x-ray was performed which showed no consolidation or acute pneumonia. She is being treated appropriately for COPD  exacerbation with steroid taper and also with Levaquin which she will continue for 9 more days. Advised to continue with her home regimen of breathing treatments.  She was able to ambulate without typical here was eager to go home.  I did spend over 30 minutes with her discussing her risk and benefits of going home today versus tomorrow.  I did talk about her  treatment in depth and we reviewed all her medications and allergies.   TOTAL TIME SPENT FOR DISCHARGE:  Greater than 30 minutes.   DISPOSITION: I believe she is in stable condition to be discharged to home on oxygen 2 liters continuous.  She will follow up me in the clinic within 10 days.     ____________________________ Dion Body, MD kl:DT D: 09/06/2014 08:40:05 ET T: 09/06/2014 09:10:43 ET JOB#: [id number]  cc: Dion Body, MD, <Dictator> Dion Body MD ELECTRONICALLY SIGNED 09/11/2014 6:36

## 2015-03-20 NOTE — Discharge Summary (Signed)
Dates of Admission and Diagnosis:  Date of Admission 20-Jan-2015   Date of Discharge 26-Jan-2015   Admitting Diagnosis Metabolic encephalopathy, Lung Cancer, COPD, Hypokalemia   Final Diagnosis Metabolic encephalopathy, Lung Cancer, COPD, Hypokalemia   Discharge Diagnosis 1 Lung Cancer   2 COPD    Chief Complaint/History of Present Illness 81 year old lady brought to the ED by family members for confusion and altered mental status. Family reported few episodes of diarrhea associated with abdominal discomfort. h/o COPD, Lung cancer stage 3A, SCC, diagnosed in 11/2013, s/p RT, closely followed by Dr. Inez Pilgrim. ED work up was remarkable for low potassium of 2.8 on initial labs.   Allergies:  Eggs: GI Distress, N/V/Diarrhea  Amitriptyline: Other  Milk: Other  Aspirin: Bleeding  Zolpidem: Anxiety  flu vaccines: Dizzy/Fainting, Other  Tetracycline: Rash  Penicillin: Swelling, Hives, Rash  Keflex: Rash  Ceclor: Swelling  EES: GI Distress, N/V/Diarrhea    Hepatic:  03-Mar-16 16:41   Bilirubin, Total 0.4  Alkaline Phosphatase 101  SGPT (ALT)  13  SGOT (AST) 24  Total Protein, Serum 7.6  Albumin, Serum  3.1  Routine Micro:  03-Mar-16 18:00   Micro Text Report URINE CULTURE   COMMENT                   MIXED BACTERIAL ORGANISMS   COMMENT                   RESULTS SUGGESTIVE OF CONTAMINATION   ANTIBIOTIC                       Specimen Source CLEAN CATCH  Culture Comment MIXED BACTERIAL ORGANISMS  Culture Comment . RESULTS SUGGESTIVE OF CONTAMINATION  Result(s) reported on 22 Jan 2015 at 09:57AM.  General Ref:  04-Mar-16 07:43   RPR w/ Reflex to Qt RPR and Conf. TP-PA ========== TEST NAME ==========  ========= RESULTS =========  = REFERENCE RANGE =  RPR W/REFLX QN,CONF TPPA  RPR, Rfx Qn RPR/Confirm TP RPR                             [   Non Reactive         ]      Non Reactive               [facility]            No: [id number]           [address],  [place], [place] [address]           Lindon Romp, MD         [phone]   Result(s) reported on 24 Jan 2015 at 05:47AM.  05-Mar-16 06:26   Cortisol, Serum RIA ========== TEST NAME ==========  ========= RESULTS =========  = REFERENCE RANGE =  CORTISOL  Cortisol Cortisol                        [   28.5 ug/dL           ]                                                          Cortisol AM  6.2 - 19.4                                        Cortisol PM         2.3 - 11.9               [facility]            No: [id number]           [address], [place], [place] [address]           Lindon Romp, MD         [phone]   Result(s) reported on 23 Jan 2015 at 02:17PM.  Cardiology:  03-Mar-16 16:54   ECG interpretation Sinus rhythm with Premature atrial complexes Septal infarct , age undetermined Abnormal ECG When compared with ECG of 06-Dec-2014 09:46, Premature atrial complexes are now Present Septal infarct is now Present ST now depressed in Inferior leads Nonspecific T wave abnormality now evident in Anterior leads ----------unconfirmed---------- Confirmed by OVERREAD, NOT ([id number]), editor PEARSON, BARBARA ([id number]) on 01/21/2015 7:58:54 AM  Routine Chem:  03-Mar-16 16:41   Glucose, Serum  111  BUN  4  Creatinine (comp) 0.66  Sodium, Serum 139  Potassium, Serum  2.8  Chloride, Serum 99  CO2, Serum 32  Calcium (Total), Serum 8.7  Anion Gap 8  Osmolality (calc) 275  eGFR (African American) >60  eGFR (Non-African American) >60 (eGFR values <51m/min/1.73 m2 may be an indication of chronic kidney disease (CKD). Calculated eGFR, using the MRDR Study equation, is useful in  patients with stable renal function. The eGFR calculation will not be reliable in acutely ill patients when serum creatinine is changing rapidly. It is not useful in patients on dialysis. The eGFR calculation may not be applicable to patients at the low and high extremes of body  sizes, pregnant women, and vegetarians.)  Urine Drugs:  [id number]  Tricyclic Antidepressant, Ur Qual (comp) NEGATIVE (Result(s) reported on 21 Jan 2015 at 04:18AM.)  Amphetamines, Urine Qual. NEGATIVE  MDMA, Urine Qual. NEGATIVE  Cocaine Metabolite, Urine Qual. NEGATIVE  Opiate, Urine qual NEGATIVE  Phencyclidine, Urine Qual. NEGATIVE  Cannabinoid, Urine Qual. NEGATIVE  Barbiturates, Urine Qual. NEGATIVE  Benzodiazepine, Urine Qual. NEGATIVE (----------------- The URINE DRUG SCREEN provides only a preliminary, unconfirmed analytical test result and should not be used for non-medical  purposes.  Clinical consideration and professional judgment should be  applied to any positive drug screen result due to possible interfering substances.  A more specific alternate chemical method must be used in order to obtain a confirmed analytical result.  Gas chromatography/mass spectrometry (GC/MS) is the preferred confirmatory method.)  Methadone, Urine Qual. NEGATIVE  Cardiac:  03-Mar-16 16:41   Troponin I < 0.02 (0.00-0.05 0.05 ng/mL or less: NEGATIVE  Repeat testing in 3-6 hrs  if clinically indicated. >0.05 ng/mL: POTENTIAL  MYOCARDIAL INJURY. Repeat  testing in 3-6 hrs if  clinically indicated. NOTE: An increase or decrease  of 30% or more on serial  testing suggests a  clinically important change)  Routine UA:  03-Mar-16 18:00   Color (UA) Amber  Clarity (UA) Hazy  Glucose (UA) Negative  Bilirubin (UA) Negative  Ketones (UA) Negative  Specific Gravity (UA) 1.021  Blood (UA) Negative  pH (UA) 6.0  Protein (UA) 30 mg/dL  Nitrite (UA) Negative  Leukocyte Esterase (UA) Trace (Result(s) reported on 20 Jan 2015 at 06:44PM.)  RBC (UA) NONE SEEN  WBC (UA) 1 /HPF  Bacteria (UA) NONE SEEN  Epithelial Cells (UA) 8 /HPF  Mucous (UA) PRESENT (Result(s) reported on 20 Jan 2015 at 06:44PM.)  Routine Coag:  03-Mar-16 16:41   Prothrombin  16.5 (11.4-15.0 NOTE: New Reference  Range  12/17/14)  INR 1.3 (INR reference interval applies to patients on anticoagulant therapy. A single INR therapeutic range for coumarins is not optimal for all indications; however, the suggested range for most indications is 2.0 - 3.0. Exceptions to the INR Reference Range may include: Prosthetic heart valves, acute myocardial infarction, prevention of myocardial infarction, and combinations of aspirin and anticoagulant. The need for a higher or lower target INR must be assessed individually. Reference: The Pharmacology and Management of the Vitamin K  antagonists: the seventh ACCP Conference on Antithrombotic and Thrombolytic Therapy. UKGUR.4270 Sept:126 (3suppl): [id number]. A HCT value >55% may artifactually increase the PT.  In one study,  the increase was an average of 25%. Reference:  "Effect on Routine and Special Coagulation Testing Values of Citrate Anticoagulant Adjustment in Patients with High HCT Values." American Journal of Clinical Pathology 2006;126:400-405.)  Routine Hem:  03-Mar-16 16:41   WBC (CBC) 7.4  RBC (CBC) 4.77  Hemoglobin (CBC)  11.9  Hematocrit (CBC) 38.0  Platelet Count (CBC) 279 (Result(s) reported on 20 Jan 2015 at 04:55PM.)  MCV 80  MCH  24.9  MCHC  31.2  RDW  17.0   PERTINENT RADIOLOGY STUDIES: XRay:    03-Mar-16 18:58, Chest Portable Single View  Chest Portable Single View   REASON FOR EXAM:    ams  COMMENTS:       PROCEDURE: DXR - DXR PORTABLE CHEST SINGLE VIEW  - Jan 20 2015  6:58PM     CLINICAL DATA:  Altered mental status. History of congestive heart  failure, COPD and lung cancer. Initial encounter.    EXAM:  PORTABLE CHEST - 1 VIEW    COMPARISON:  Portable chest 11/11/2014.  PET-CT 11/09/2014.    FINDINGS:  1835 hours. The heart size and mediastinal contours are stable.  There is aortic atherosclerosis. Right apical mass appears similar  to prior studies allowing for positional differences. There is no  apparent associated  rib destruction. Mild left basilar atelectasis  is present. There is no confluent airspace opacity or additional  nodularity. There is no significant pleural effusion. Postsurgical  changes are noted in the lower cervical spine and right shoulder.     IMPRESSION:  1. Grossly stable right apical mass corresponding with known lung  cancer.  2. No acute cardiopulmonary process.      Electronically Signed    By: Richardean Sale M.D.    On: 01/20/2015 19:21         Verified By: Vivia Ewing, M.D.,  [place]:    07-Mar-16 09:24, US Abdomen General Survey  US Abdomen General Survey   REASON FOR EXAM:    abdominal pain, diarrhea  COMMENTS:       PROCEDURE: [place]  - US ABDOMEN GENERAL SURVEY  - Jan 24 2015  9:24AM     CLINICAL DATA:  Abdominal pain and diarrhea for 5 days, history lung  cancer    EXAM:  ULTRASOUND ABDOMEN COMPLETE    COMPARISON:  CT abdomen and pelvis 06/24/2014    FINDINGS:  Gallbladder: Surgically absent  Common bile duct: Diameter: 4 mm diameter, normal    Liver: Normal appearance    IVC: Normal appearance  Pancreas: Normal appearance    Spleen: Normal appearance, 11.4 cm length    Right Kidney: Length: 12.3 cm. Cortical thinning. Prominent column  of Bertin at mid kidney. No definite mass or hydronephrosis.    Left Kidney: Length: 11.2 cm. Cortical thinning. Upper normal  cortical echogenicity. No mass or hydronephrosis.  Abdominal aorta: Mid to distal abdominal aortic aneurysm 4.0 x 3.6  cm, does not extend into bifurcation.    Other findings: No free-fluid     IMPRESSION:  Post cholecystectomy.    Abdominal aortic aneurysm 4.0 x 3.6 cm, not significantly changed  from the 3.9 cm measured on most recent prior abdominal CT.    No acute abnormalities.      Electronically Signed    By: Lavonia Dana M.D.    On: 01/24/2015 10:02         Verified By: Burnetta Sabin, M.D.,  CT:    03-Mar-16 17:53, CT Head Without Contrast  CT Head Without  Contrast   REASON FOR EXAM:    ams  COMMENTS:       PROCEDURE: CT  - CT HEAD WITHOUT CONTRAST  - Jan 20 2015  5:53PM     CLINICAL DATA:  Strange behavior reported by patient's daughter    EXAM:  CT HEAD WITHOUT CONTRAST    TECHNIQUE:  Contiguous axial images were obtained from the base of the skull  through the vertex without intravenous contrast.    COMPARISON:  10/04/2014  FINDINGS:  The bony calvarium is intact with the exception of postsurgical  changes in the right occipital region. A mucosal retentioncyst is  noted within the left maxillary antrum.    No findings to suggest acute hemorrhage, acute infarction or  space-occupying mass lesion are noted.     IMPRESSION:  Postsurgical changes in the right occipital region. No acute  intracranial abnormality is noted.      Electronically Signed    By: Inez Catalina M.D.    On: 01/20/2015 18:06         Verified By: Everlene Farrier, M.D.,    06-Mar-16 13:21, CT Head WWO Contrast  CT Head WWO Contrast   REASON FOR EXAM:    altered mental status in patient with lung cancer  COMMENTS:  Change for correct order per Dr. Caryl Comes, via phone call at   12:55 on 3/6     PROCEDURE: CT  - CT HEAD W/WO  - Jan 23 2015  1:21PM     CLINICAL DATA:  Increasing alteredmental status and history of lung  cancer.    EXAM:  CT HEAD WITHOUT AND WITH CONTRAST    TECHNIQUE:  Contiguous axial images were obtained from the base of the skull  through the vertex without and with intravenous contrast  CONTRAST:  75 mL Omnipaque 300    COMPARISON:  01/20/2015 noncontrast head CT. 01/26/2014 head CT  without and with contrast.    FINDINGS:  There is no evidence of acute cortical infarct, intracranial  hemorrhage, intra-axial mass, midline shift, or extra-axial fluid  collection. There is mild generalized cerebral atrophy, unchanged.  Patchy hypodensities in the cerebral white matter have not  significantly changed and are nonspecific but  compatible with mild  to moderate chronic small vessel ischemic disease. 1.1 cm partially  calcified mass near the right porus acusticus is unchanged and  likely represents a meningioma. No abnormal enhancement is  identified elsewhere.  Prior bilateral cataract extraction is noted. Mastoid air cells  are  clear. Left maxillary sinusmucous retention cyst is partially  visualized. Prior right retromastoid craniotomy is again noted.  There is mild bilateral chronic calcification.     IMPRESSION:  1. No evidence of acute intracranial abnormality or intracranial  metastases.  2. Mild to moderate chronic small vessel ischemic disease.  3. Unchanged likely meningioma near the right porus acusticus.      Electronically Signed    By: Logan Bores    On: 01/23/2015 13:38     Verified By: Ferol Luz, M.D.,   Pertinent Past History:  Pertinent Past History COPD Lung cancer stage 3A, SCC, diagnosed in 11/2013, s/p RT   Hospital Course:  Hospital Course Patient was admitted for further management of metabolic encephalopathy. She was evaluated by Psychiatry, Neurology and Oncology. Initial CT was negative. Urine drug screen was negative. Serum Cortisol was within normal limits. RPR was non-reactive. No obvious focus of infection. Right apical lung mass was stable on CXR. Urinalysis and routine labs were not suggestive of infection. Hypokalemia was corrected. Patient did not have any episodes of diarrhea or abdominal pain during her hospital stay. Ct head with contrast did not reveal any metastasis to the brain. Lung cancer stage 3A, SCC, diagnosed in 11/2013, s/p RT. Patient was evaluated by oncology. COPD remained stable and controlled during her stay. Patient had periods of agitation initially. She required a sitter for the initial part of her stay. Patient's agitation resolved. She became alert, oriented x3 and returned to baseline. Sitter was discontinued. Patient ambulated with physical  therapist. Plan is to discharge patient to Rehab.   Condition on Discharge Satisfactory   DISCHARGE INSTRUCTIONS HOME MEDS:  Medication Reconciliation: Patient's Home Medications at Discharge:     Medication Instructions  advair diskus 250 mcg-50 mcg inhalation powder  1 puff(s) inhaled 2 times a day   fentanyl  12 microgram(s) topically every 3 days   oxycodone 5 mg oral tablet  1 tab(s) orally every 6 hours, As needed, severe pain (7-10/10)   haloperidol  2 milligram(s) intramuscular every 2 hours, As needed, agitation   docusate sodium 100 mg oral capsule  1 cap(s) orally 2 times a day, As needed, Stool Softener   senna  1 tab(s) orally 2 times a day, As needed, constipation    PRESCRIPTIONS: PRINTED AND PLACED ON CHART  STOP TAKING THE FOLLOWING MEDICATION(S):    potassium chloride 20 meq oral tablet, extended release: 1 tab(s) orally 2 times a day buspirone: 1 tab(s) orally once a day buspirone 10 mg oral tablet: 1 tab(s) orally 2 times a day celexa 10 mg oral tablet: 1 tab(s) orally once a day gabapentin 600 mg oral tablet: 2 tab(s) orally 3 times a day sertraline 50 mg oral tablet: 3 tab(s) orally once a day  Physician's Instructions:  Diet Regular   Activity Limitations As tolerated   Return to Work Not Applicable   Time frame for Follow Up Appointment 1-2 weeks  Dr. Glendon Axe   Time frame for Follow Up Appointment 2-4 weeks  Dr. Inez Pilgrim   Time frame for Follow Up Appointment [place], Anna): [facility], [address], [place], [place] [address], [place], [place], [place] T(Consultant): [place], [address] [place], [place], [place] [address], [place], Jay Haskew(Family Physician): [facility], [address] [place], [place] [address],  [phone]  Electronic Signatures: Glendon Axe  (MD)  (Signed 09-Mar-16 12:57)  Authored: ADMISSION DATE AND DIAGNOSIS, CHIEF COMPLAINT/HPI, Allergies, PERTINENT LABS, PERTINENT RADIOLOGY STUDIES, PERTINENT PAST HISTORY, HOSPITAL COURSE, DISCHARGE INSTRUCTIONS HOME MEDS, PATIENT INSTRUCTIONS, Follow Up Physician   Last Updated: 09-Mar-16 12:57 by Glendon Axe (MD)

## 2015-03-20 NOTE — Consult Note (Signed)
PATIENT NAME:  Gail Swanson, Gail Swanson MR#:  [id number] DATE OF BIRTH:  Oct 02, 1932  DATE OF CONSULTATION:  01/21/2015  CONSULTING PHYSICIAN:  Leotis Pain, MD  REASON FOR CONSULTATION: Confusion and agitation.  HISTORY OF PRESENT ILLNESS: An 81 year old female with significant medical history,  brought to ED by her daughter complaining that patient is altered, confused, and not following commands. The patient has been having bouts of diarrhea, found to be agitated. Imaging: CAT scan of the head did not show any acute intracranial abnormality. The patient is still not back to baseline, agitated.   PAST MEDICAL HISTORY: Congestive heart failure, AAA, obstructive sleep apnea, esophageal stricture, coronary artery disease, hypertension, COPD, mitral valve prolapse, chronic back pain.   HOME MEDICATIONS: Have been reviewed.   ALLERGIES: Have been reviewed.  REVIEW OF SYSTEMS: Unable to obtain due to patient's agitation.   NEUROLOGIC: The patient does not follow commands, told me her name, appears to be agitated. No focal neurological deficits have been seen as the patient is moving all her  extremities equally. Visual fields appear to be intact.   IMPRESSION: An 81 year old female admitted with altered mental status, not following commands. I believe the patient is delirious. The patient was given Ativan, which I have asked to discontinue.   PLAN: The patient started Seroquel 25 mg q. 8 hours for agitation. Hopefully, she will get some sleep and will improve her symptoms. I do not think benzodiazepines should be used in this case as they will worsen agitation.   Thank you. It was a pleasure seeing this patient.   This case was discussed with nursing staff.    ____________________________ Leotis Pain, MD yz:ts D: 01/21/2015 16:17:58 ET T: 01/21/2015 16:59:44 ET JOB#: [id number]  cc: Leotis Pain, MD, <Dictator> Leotis Pain MD ELECTRONICALLY SIGNED 02/03/2015 12:11

## 2015-03-20 NOTE — Consult Note (Signed)
PATIENT NAME:  Gail Swanson, Gail Swanson MR#:  [id number] DATE OF BIRTH:  09/15/1932  DATE OF CONSULTATION:  01/22/2015 CONSULTING PHYSICIAN:  Aidynn Polendo R. Ma Hillock, MD  REFERRING PHYSICIAN: Dr. Caryl Comes   REASON FOR CONSULTATION:  A history of lung cancer followed by Dr. Inez Pilgrim, admitted with encephalopathy, and altered mental status.   HISTORY OF PRESENT ILLNESS: The patient is an 81 year old female patient with past medical history significant for multiple medical problems including chronic obstructive pulmonary disease, hypertension, coronary artery disease, mitral valve prolapse, chronic back pain, congestive heart failure, abdominal aortic aneurysm, obstructive sleep apnea, on CPAP, esophageal stricture, non-small cell lung cancer (per review of Dr. Marylene Land notes).  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 Most recent radiological evaluation was CAT attempted on December 22 but reported to be incomplete study since she was not able to complete it properly, but did report that there was reduced activity in the right Pancoast tumor, although still high SUV of 14. The patient has been admitted to the hospital with behavioral changes, altered mental status and agitation. She has been seen by the neurology and mental health and has been started on medications for agitation and encephalopathy. So far, she had CT scan of the head without contrast, which showed postsurgical changes in the right occipital region and no acute intracranial abnormality. Per discussion with Dr. Caryl Comes, they attempted to do CT scan of the head with contrast, but she was unable to cooperate and study was not done. The patient currently is sitting in recliner chair, family members at bedside including son, daughter-in-law, and daughter. They state that she is less agitated and does recognize family members and  friends, but otherwise does not converse appropriately and is disoriented. The patient denies any pain issues. They state that she has lost about 20 to 25 pounds in the last 1 to 2 months and has not been eating well. She has gotten weaker. They deny her having any recent complaints of headaches or double vision. No nausea or vomiting either. The patient, otherwise, unable to provide history due to her disorientation.   PAST MEDICAL AND SURGICAL HISTORY: As in HPI above.   FAMILY HISTORY: Noncontributory.   SOCIAL HISTORY: Ex-smoker, quit few years ago. No history of alcohol usage.   ALLERGIES: INCLUDE ASPIRIN, TETRACYCLINE, KEFLEX, PENICILLIN, ZOLPIDEM, EGGS, AMITRIPTYLINE, MILK,  AND CECLOR.   HOME MEDICATIONS: Chloride 20 mEq b.i.d., oxycodone 5 mg q.6 h. p.r.n. fentanyl patch 12 mcg q.72 h. buspirone, dose unknown to family, Proair 90 mcg 2 puffs every six hours p.r.n.   REVIEW OF SYSTEMS: As in history of present illness. Otherwise, unable to obtain, since the patient is disoriented.   PHYSICAL EXAMINATION:  GENERAL: The patient is a moderately built and well-nourished individual, sitting in a recliner chair, awake, but does not converse appropriately. No agitation or acute distress at this time. No icterus.  VITAL SIGNS: 98.5, 86, 16, 115/75, and 95% on room air.  HEENT: Normocephalic, atraumatic. Extraocular movements intact. Sclerae anicteric. Mouth is moist.  NECK: Negative for lymphadenopathy.  HEART: S1 and S2, regular rate and rhythm.  LUNGS: Bilateral diminished breath sounds, more so in the right upper lobe area. No crepitations or rhonchi noted.  ABDOMEN: Soft, nondistended, nontender.  EXTREMITIES: Show bilateral mild edema. No cyanosis.  SKIN: Shows no generalized rashes or major bruising.  NEUROLOGIC:  Unable to examine in  detail. Moves all extremities spontaneously.    LABORATORY RESULTS:  BUN 8, creatinine 0.83, glucose 98, potassium 2.9, sodium 140, calcium 8.9. TSH  normal at 3.17, hemoglobin 11.6, platelets 239, WBC 6400, ANC 4200, magnesium 2.4 and lipase 70.   IMPRESSION AND RECOMMENDATIONS: The patient is an 81 year old female with multiple medical problems as described above. 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 She had incomplete study PET scan in December, but it reported that there was residual tumor in the right apical lung, a fairly high SUV although less intense than on previous study. She has had recent failure to thrive and unintentional weight loss. She is currently admitted with altered mental status, personality changes, and is receiving medication for this neurology and mental health. I have independently reviewed a noncontrast CT scan of the head and explained to the patient and family that there are no acute changes, but this does not rule out possibility of occult brain metastasis given history of non-small cell lung cancer. She was unable to cooperate for a CT head with contrast. Per discussion with attending physician Dr. Caryl Comes, plan is to see if agitation will be better controlled with ongoing medications and attempt to get CT scan of the head with contrast or MRI of the brain with/without gadolinium early next week to evaluate for metastatic disease. Continue other supportive treatment. The patient does not have any fevers or electrolyte abnormalities including hypercalcemia at this time. Given above complicated history and history of known lung cancer history, overall prognosis is poor. The patient has a copy of living will in the chart.  I have discussed this with her healthcare power of attorney, son and daughter-in-law (and also daughter is present) who are present in the room and discussed CODE STATUS, and they do not want mechanical ventilation or resuscitation in case of cardiopulmonary failure/arrest, will order DO NOT RESUSCITATE  status accordingly.  Family understands that overall prognosis is very poor if she has developed brain metastasis from lung cancer, but I have also explained that altered mental status could be secondary to other etiologies also. Will request her primary oncologist, Dr. Inez Pilgrim, to follow Monday, March 7 onwards.   Thank you for the referral. Please feel free to contact me if any additional questions    ____________________________ Rhett Bannister. Ma Hillock, MD srp:at D: 01/22/2015 20:15:31 ET T: 01/22/2015 20:45:05 ET JOB#: [id number]  cc: Trevor Iha R. Ma Hillock, MD, <Dictator> Alveta Heimlich MD ELECTRONICALLY SIGNED 01/23/2015 9:26

## 2015-03-20 NOTE — H&P (Signed)
PATIENT NAME:  Gail Swanson, Gail Swanson MR#:  [id number] DATE OF BIRTH:  1932-05-22  DATE OF ADMISSION:  01/20/2015  TIME OF ADMISSION: 8:25 p.m.   CHIEF COMPLAINT:  Altered mental status.   HISTORY OF PRESENT ILLNESS: This is an 81 year old female patient with significant complicated past medical history who was brought into the ED today by her daughter and 2 other family relatives with a complaint that she was not acting like herself and she had some altered mental status that she was "talking out of her head."   Per the patient's daughter she usually calls her multiple times a day and over the last day and a half had not really been calling her. Patient's daughter did get calls from the office staff of the apartment complex where the patient lives by herself today stating that the patient was not acting like herself. The patient also has home aide that comes and helps her the house, and has been reporting intermittently over last couple weeks that she has been having some bizarre behavior such as 2 weeks ago when she was refusing to get dressed. The aid went to her house and found her naked and the patient refused to get dressed, but by the next day the aid stated the patient was back to her normal behavior. So, today when the patient was not calling her daughter, her daughter was concerned, office staff said that she was not acting normally and so daughter decided to bring her in to the ED for evaluation.   The patient on interview, states she feels fine, states that she is not confused and that there is nothing wrong; however, through further examination and interview, she does make some comments that do not make sense, does deny saying things that she said just minutes prior, and denies remembering pieces of conversation that just happened. The patient has also been complaining over the last 2 weeks of bilateral flank pain, which she says pushes down into her abdomen. Per the daughter, the patient did have some  small amount of diarrhea today, though it is unclear if she has had any other symptoms prior to this. The patient denies all symptoms on review of systems. See full review of systems below.   Here at the ED, the patient has had a normal work-up so far, normal laboratory results on basic screening, except for a mildly low potassium at 2.8, and a little bit anemia, hemoglobin 11.9. Her CT head was largely within normal limits, only showing some right occipital postsurgical changes and her chest x-ray also only showed stable known right apical mass, which is part of the patient's known history of lung cancer.   PRIMARY MEDICAL DOCTOR:  Glendon Axe, MD   PAST MEDICAL HISTORY: Congestive heart failure, lung cancer, abdominal aortic aneurysm, obstructive sleep apnea on CPAP; esophageal stricture, coronary artery disease, hypertension, COPD, mitral valve prolapse, chronic back pain.   CURRENT MEDICATIONS: ProAir 90 mcg 2 puffs q. 6 hours p.r.n., potassium 20 mEq b.i.d., oxycodone 5 mg q. 6 p.r.n., fentanyl patch 12 mcg q. 72 hours; buspirone, unknown dose; Advair unknown dose.   PAST SURGICAL HISTORY: Bilateral shoulder surgeries, cervical laminectomy, appendectomy, hysterectomy, left total knee replacement, brain surgery.   ALLERGIES: ZOLPIDEM, ASPIRIN, EGGS, TETRACYCLINE, KEFLEX, CECLOR, PENICILLIN, AMITRIPTYLINE, MILK.   FAMILY HISTORY: Includes hypertension, CAD, diabetes, cancer.   SOCIAL HISTORY: Patient is an ex-smoker, quit 6 years ago. She is a nondrinker and denies any other illicit drug use.   REVIEW OF SYSTEMS:  CONSTITUTIONAL: Patient denies fever, fatigue, weakness.  EYES: Denies blurred or double vision, denies pain or redness.  EARS AND NOSE AND THROAT: Denies ear pain, denies hearing loss; denies difficulty swallowing.  RESPIRATORY: Denies cough, wheeze, dyspnea, hemoptysis.  CARDIOVASCULAR: Denies chest pain, orthopnea, edema, palpitations.  GASTROINTESTINAL: Denies nausea,  vomiting; patient denies diarrhea, although her daughter states she had some diarrhea today. Denies abdominal pain though per her daughter she has had some intermittent complaints of abdominal pain over the past couple weeks; denies constipation.  GENITOURINARY:  Denies dysuria, hematuria, or frequency.  ENDOCRINE: Denies nocturia, thyroid problems in, and heat and cold intolerance.  HEMATOLOGIC AND LYMPHATIC: Denies easy bruising, bleeding or swollen glands. INTEGUMENTARY: Denies acne, rash or lesions.  MUSCULOSKELETAL: Denies arthritis; endorses some pain in her right shoulder and along her right arm, denies gout.  NEUROLOGICAL: Denies numbness, weakness, headaches.  PSYCHIATRIC: Denies anxiety, insomnia, depression.   PHYSICAL EXAM:  VITAL SIGNS: Blood pressure 149/82, pulse 84, temperature 97.8, respirations 23, with 100% oxygen saturation on 2 liters oxygen.  HEENT: Pupils equal, round, react to light and accommodation, extraocular movements intact, no scleral icterus, moist mucosal membranes.  NECK: Thyroid is nonenlarged, supple with no masses, nontender; no JVD noted, no cervical adenopathy.  RESPIRATORY: Clear to auscultation bilaterally with no rales, rhonchi or wheezes, she had good breath sounds throughout.  CARDIOVASCULAR: Regular rate and rhythm. No murmur, rubs or gallops appreciated on auscultation. She had good femoral pulses and no lower extremity edema.  ABDOMEN: Soft, nontender, nondistended, with good bowel sounds and no hepatosplenomegaly.  MUSCULOSKELETAL: She had 5 out of 5 muscular strength in all 4 extremities with full range of motion spontaneously throughout, except for her right shoulder, where she had some inability to extended it completely above her head and this is a chronic condition. No distal cyanosis or clubbing.  SKIN: Normal with no rashes, lesions or erythema; was warm and dry, and intact.  LYMPHATIC: No adenopathy noted.  NEUROLOGICAL: Cranial nerves II  through XII grossly intact; sensation intact throughout, no dysarthria. The patient does have some confused statements, she is not completely oriented to time but no outright dysarthria or aphasia noted.  PSYCHIATRIC: Again, the patient is alert and oriented to person and place, though not completely oriented to time or to circumstance. She shows poor insight and some memory impairment, along with some mild confusion.   LABORATORY: As stated in the HPI, include white count 7.4, hemoglobin 11.9, hematocrit 38, platelets 279,000, sodium 139, potassium 2.8, chloride 99, bicarbonate 32, BUN 4, creatinine 0.66, glucose was 111, total protein 7.6, albumin 3.1, alkaline phosphatase 101, AST 24, ALT 13, troponin less than 0.02, INR was 1.3; UA was largely negative except for trace leukocyte esterase, no nitrates, no white cells, and no bacteria seen.   RADIOLOGY: CT head as stated in HPI, showed right occipital postsurgical changes with no other significant abnormality except for some age-related atrophy. Chest x-ray showed a stable known right apical mass with no other cardiopulmonary disease.   ASSESSMENT AND PLAN: 1.  Altered mental status, unclear etiology at this time and patient is largely stable, though she is making some confusing statements. We have ordered a neurology consult. We will do a urine drug screen just to rule out whether or not the patient may have confused some of her medications. She may also need an MRI at some point, based on neurology's recommendations.  2.  Congestive heart failure. Unclear on chart review, this is systolic or diastolic.  The patient has a reported history this. We will perform an echocardiogram for further evaluation here.  3.  Hypokalemia. This is very mild with potassium only down to 2.8; however, we will replace that here.  4.  Obstructive sleep apnea. Unclear at this time if the patient is truly compliant with her CPAP at home; reports having it, but was not able to  say if she uses it frequently or not; we will order CPAP here inpatient.  5.  Chronic obstructive pulmonary disease. This seems to be stable with a stable chest x-ray here, today, we will continue patient's home inhalers.  6.  Back and shoulder pain. This is a chronic problem and again is relatively stable here. We will make an effort to avoid further confusing medications here; however, we will continue the patient's scheduled home dose of opioids for her chronic pain.  7.  This patient is full code.  8.  Deep vein thrombosis prophylaxis with Lovenox and sequential compression devices.   Time spent on this admission was 50 minutes.    ____________________________ Wilford Corner. Jannifer Franklin, MD dfw:[id number] D: 01/20/2015 21:42:45 ET T: 01/20/2015 22:38:14 ET JOB#: [id number]  cc: Wilford Corner. Jannifer Franklin, MD, <Dictator> Iyannah Blake Fawn Kirk MD ELECTRONICALLY SIGNED 01/21/2015 2:05

## 2015-03-20 NOTE — Consult Note (Signed)
Psychiatry: Gail Swanson is remarkably better today than she was yesterday. Fully oriented and able to describe her living situation clearly. Affect euthymic and calm. Family also notices a huge improvement. NO change to current treatment plan. Will follow as needed  Electronic Signatures: Keynan Heffern, Madie Reno (MD)  (Signed on 08-Mar-16 23:50)  Authored  Last Updated: 08-Mar-16 23:50 by Gonzella Lex (MD)

## 2015-03-20 NOTE — Consult Note (Signed)
Psychiatry: Follow-up for this 81 year old woman with acute delirium.  On interview today the patient says she is feeling fine.  She has no complaints.  She denies any pain.  Says her appetite is good.  She is not aware of having any confusion. exam the patient is confabulating and confused.  She can't tell me where she is.  She knows the year but can't explain why she is in the hospital.  Talks about living with people who are deceased.  Can't really stay on topic. remains delirious.  Continue when necessary medication.  Does not appear to be able to take care of herself independently at this time nor does she understand the situation well enough to make reasonable decisions.  Electronic Signatures: Gonzella Lex (MD)  (Signed on 07-Mar-16 16:58)  Authored  Last Updated: 07-Mar-16 16:58 by Gonzella Lex (MD)

## 2015-03-20 NOTE — Consult Note (Signed)
PATIENT NAME:  Gail Swanson, POL MR#:  [id number] DATE OF BIRTH:  09-May-1932  DATE OF CONSULTATION:  01/21/2015  REFERRING PHYSICIAN:   CONSULTING PHYSICIAN:  Gonzella Lex, MD  IDENTIFYING INFORMATION AND REASON FOR CONSULT: This is an 81 year old woman who came into the hospital with altered mental status. Consult for evaluation of confusion.   HISTORY OF PRESENT ILLNESS: Information primarily from the chart. The patient was not able to give any history. She was admitted to the hospital on March 3 after presenting in the company of her family. The family was noting that she had been increasingly confused. When she was first evaluated in the Emergency Room it sounds like the patient was still able to give some lucid history, but was showing signs of confusion. Since then things seem to have decompensated. When I came to see the patient today she was sound asleep and unarousable. The nurse sitting with her told me that she had only been like that briefly and previously had been agitated for much of the afternoon. During the time of agitation it was reported that she was not making any sense. Would say confusing words that did not tie together. It sounds like she was pretty delirious. Also she was trying to stand up and was getting combative at times. From what I can see from the history family report that this had been a fairly rapid onset over a couple of days prior to admission. There is no indication that there had been any change in medication or any other new stress. So far the medical workup has not shown any specific finding.   PAST PSYCHIATRIC HISTORY: Interestingly the patient did present to the Emergency Room just a month ago and was evaluated by the psychiatric intake team because of an allegation that she had made suicidal ideation. At that time she was not even seen by a psychiatrist, it was determined that she was not suicidal, and she was released from the Emergency Room. It is listed in her  old notes that she has a history of depression, but I do not see anything in the computer record about seeing psychiatry before. No known history of other mental health problems. No known history of hospitalization, suicide attempts, or other major mental illnesses. I do notice that she had a presentation to the hospital in 2011 with almost identical circumstances with a delirium for which no specific cause was ever found, but then resolved.   SOCIAL HISTORY: I am not clear from the chart exactly what her living situation is. Whether she lives independently and if so in what kind of facility, or in a completely independent apartment. It sounds like family do keep in pretty close touch with her.  PAST MEDICAL HISTORY:  She has an extensive history of medical problems including congestive heart failure, history of lung cancer, history of an aortic aneurysm, obstructive sleep apnea, esophageal stricture, coronary artery disease, hypertension, COPD, mitral valve prolapse.   FAMILY HISTORY: Unknown.   CURRENT MEDICATIONS:  ProAir inhaler 2 puffs q. 6 hours as needed for shortness of breath, potassium 20 mEq twice a day, oxycodone 5 mg q. 6 hours as needed, fentanyl patch 12 mcg every 72 hours, BuSpar unknown dose, Advair unknown dose. It is not clear if this is the complete list.    ALLERGIES: AMITRIPTYLINE, ASPIRIN, CECLOR, FLU VACCINE, KEFLEX, PENICILLIN, TETRACYCLINE, ZOLPIDEM.    REVIEW OF SYSTEMS: The patient was not able to give any information at all.   MENTAL STATUS  EXAMINATION: Disheveled, chronically ill-looking woman, looks her stated age. She was sitting up in a chair, but was sound asleep. Both the nurse on duty and I spoke her name several times and gently shook her. I got her to open her eyes very briefly, but she did not make eye contact and she never vocalized anything intelligible. Could not make any other mental status exam at this point.   LABORATORY RESULTS: Drug screen is all  negative.  Chemistry panel, low potassium 2.8, slightly high glucose 111, otherwise unremarkable. Troponins negative. Lipase is low. Potassium is still running low today. Hematology panel essentially unremarkable. CT scan of the brain without contrast shows no acute abnormalities, sounds like it is pretty stable.   VITAL SIGNS: Blood pressure 146/84, respirations 20, pulse 90, temperature 99.1.   ASSESSMENT: This is an 81 year old woman who presents with a fairly acute worsening of delirium. Although she gets agitated at times she does not appear to be manic. It is unknown if she has had any past psychiatric history that could be playing a part in this.  For the time being best to simply treat the delirium. Cause appears to still be unclear.   TREATMENT PLAN: Agree with neurology about avoiding benzodiazepines for the most part. The patient has been unable to cooperate with oral Seroquel this evening. I ordered a 1 time dose of IM Geodon. It is not clear to me if she has IV access at this point. I will try ordering p.r.n. doses of IV Haldol for her agitation. Meanwhile the workup will continue. We will follow after the weekend. Follow up with Dr. Franchot Mimes if needed beforehand.    DIAGNOSIS PRINCIPAL AND PRIMARY:   AXIS I: Delirium, unknown etiology.   SECONDARY DIAGNOSES:   AXIS I: No further.   AXIS II: Deferred.   AXIS III: Multiple medical problems including congestive heart, history of cancer.    ____________________________ Gonzella Lex, MD jtc:bu D: 01/21/2015 18:49:25 ET T: 01/21/2015 19:15:14 ET JOB#: [id number]  cc: Gonzella Lex, MD, <Dictator> Gonzella Lex MD ELECTRONICALLY SIGNED 01/25/2015 10:41

## 2015-05-24 ENCOUNTER — Encounter: Payer: Self-pay | Admitting: Internal Medicine

## 2015-05-24 ENCOUNTER — Inpatient Hospital Stay: Payer: Medicare Other | Attending: Internal Medicine | Admitting: Internal Medicine

## 2015-05-24 VITALS — BP 119/67 | HR 74 | Temp 98.0°F | Resp 18 | Ht 60.24 in | Wt 143.3 lb

## 2015-05-24 DIAGNOSIS — C3411 Malignant neoplasm of upper lobe, right bronchus or lung: Secondary | ICD-10-CM | POA: Diagnosis present

## 2015-05-24 DIAGNOSIS — J449 Chronic obstructive pulmonary disease, unspecified: Secondary | ICD-10-CM | POA: Diagnosis not present

## 2015-05-24 DIAGNOSIS — I714 Abdominal aortic aneurysm, without rupture: Secondary | ICD-10-CM | POA: Diagnosis not present

## 2015-05-24 DIAGNOSIS — Z923 Personal history of irradiation: Secondary | ICD-10-CM | POA: Insufficient documentation

## 2015-05-24 DIAGNOSIS — M25511 Pain in right shoulder: Secondary | ICD-10-CM | POA: Insufficient documentation

## 2015-05-24 DIAGNOSIS — R0602 Shortness of breath: Secondary | ICD-10-CM | POA: Diagnosis not present

## 2015-05-24 DIAGNOSIS — Z87891 Personal history of nicotine dependence: Secondary | ICD-10-CM | POA: Insufficient documentation

## 2015-05-24 DIAGNOSIS — M549 Dorsalgia, unspecified: Secondary | ICD-10-CM | POA: Insufficient documentation

## 2015-05-24 DIAGNOSIS — R531 Weakness: Secondary | ICD-10-CM

## 2015-05-24 DIAGNOSIS — K222 Esophageal obstruction: Secondary | ICD-10-CM

## 2015-05-24 DIAGNOSIS — R05 Cough: Secondary | ICD-10-CM | POA: Diagnosis not present

## 2015-05-24 DIAGNOSIS — R0781 Pleurodynia: Secondary | ICD-10-CM | POA: Diagnosis not present

## 2015-05-24 DIAGNOSIS — I341 Nonrheumatic mitral (valve) prolapse: Secondary | ICD-10-CM

## 2015-05-24 DIAGNOSIS — G4730 Sleep apnea, unspecified: Secondary | ICD-10-CM | POA: Diagnosis not present

## 2015-05-24 DIAGNOSIS — I2510 Atherosclerotic heart disease of native coronary artery without angina pectoris: Secondary | ICD-10-CM

## 2015-05-24 DIAGNOSIS — G8929 Other chronic pain: Secondary | ICD-10-CM | POA: Insufficient documentation

## 2015-05-25 ENCOUNTER — Telehealth: Payer: Self-pay | Admitting: *Deleted

## 2015-05-25 NOTE — Telephone Encounter (Signed)
States she thought she was going to have fentanyl patch sent to pharmacy. After discussing with Dr Ma Hillock and then Lelon Frohlich, it was decided to wait until Friday to get patch

## 2015-05-26 ENCOUNTER — Ambulatory Visit
Admission: RE | Admit: 2015-05-26 | Discharge: 2015-05-26 | Disposition: A | Discharge status: Home / Self Care | Payer: Medicare Other | Source: Ambulatory Visit | Attending: Internal Medicine | Admitting: Internal Medicine

## 2015-05-26 DIAGNOSIS — R599 Enlarged lymph nodes, unspecified: Secondary | ICD-10-CM | POA: Diagnosis not present

## 2015-05-26 DIAGNOSIS — C3411 Malignant neoplasm of upper lobe, right bronchus or lung: Secondary | ICD-10-CM | POA: Insufficient documentation

## 2015-05-26 DIAGNOSIS — I714 Abdominal aortic aneurysm, without rupture: Secondary | ICD-10-CM | POA: Diagnosis not present

## 2015-05-26 DIAGNOSIS — K5730 Diverticulosis of large intestine without perforation or abscess without bleeding: Secondary | ICD-10-CM | POA: Diagnosis not present

## 2015-05-26 DIAGNOSIS — M47816 Spondylosis without myelopathy or radiculopathy, lumbar region: Secondary | ICD-10-CM | POA: Diagnosis not present

## 2015-05-26 DIAGNOSIS — M47814 Spondylosis without myelopathy or radiculopathy, thoracic region: Secondary | ICD-10-CM | POA: Insufficient documentation

## 2015-05-26 DIAGNOSIS — J320 Chronic maxillary sinusitis: Secondary | ICD-10-CM | POA: Diagnosis not present

## 2015-05-26 LAB — GLUCOSE, CAPILLARY: GLUCOSE-CAPILLARY: 96 mg/dL (ref 65–99)

## 2015-05-26 MED ORDER — FLUDEOXYGLUCOSE F - 18 (FDG) INJECTION
11.2200 | Freq: Once | INTRAVENOUS | Status: AC | PRN
  Administered 2015-05-26: 11.22 via INTRAVENOUS

## 2015-05-27 ENCOUNTER — Inpatient Hospital Stay (HOSPITAL_BASED_OUTPATIENT_CLINIC_OR_DEPARTMENT_OTHER): Payer: Medicare Other | Admitting: Internal Medicine

## 2015-05-27 ENCOUNTER — Encounter: Payer: Self-pay | Admitting: Internal Medicine

## 2015-05-27 VITALS — BP 109/64 | HR 75 | Temp 96.0°F | Resp 18 | Wt 140.9 lb

## 2015-05-27 DIAGNOSIS — R05 Cough: Secondary | ICD-10-CM

## 2015-05-27 DIAGNOSIS — I714 Abdominal aortic aneurysm, without rupture: Secondary | ICD-10-CM

## 2015-05-27 DIAGNOSIS — C3411 Malignant neoplasm of upper lobe, right bronchus or lung: Secondary | ICD-10-CM | POA: Diagnosis not present

## 2015-05-27 DIAGNOSIS — F449 Dissociative and conversion disorder, unspecified: Secondary | ICD-10-CM

## 2015-05-27 DIAGNOSIS — I2510 Atherosclerotic heart disease of native coronary artery without angina pectoris: Secondary | ICD-10-CM

## 2015-05-27 DIAGNOSIS — Z87891 Personal history of nicotine dependence: Secondary | ICD-10-CM

## 2015-05-27 DIAGNOSIS — R0781 Pleurodynia: Secondary | ICD-10-CM

## 2015-05-27 DIAGNOSIS — M549 Dorsalgia, unspecified: Secondary | ICD-10-CM | POA: Diagnosis not present

## 2015-05-27 DIAGNOSIS — G8929 Other chronic pain: Secondary | ICD-10-CM | POA: Diagnosis not present

## 2015-05-27 DIAGNOSIS — I341 Nonrheumatic mitral (valve) prolapse: Secondary | ICD-10-CM

## 2015-05-27 DIAGNOSIS — G4730 Sleep apnea, unspecified: Secondary | ICD-10-CM

## 2015-05-27 DIAGNOSIS — R531 Weakness: Secondary | ICD-10-CM

## 2015-05-27 DIAGNOSIS — R0602 Shortness of breath: Secondary | ICD-10-CM

## 2015-05-27 DIAGNOSIS — Z923 Personal history of irradiation: Secondary | ICD-10-CM

## 2015-05-27 DIAGNOSIS — M25511 Pain in right shoulder: Secondary | ICD-10-CM

## 2015-05-27 DIAGNOSIS — K222 Esophageal obstruction: Secondary | ICD-10-CM

## 2015-05-27 MED ORDER — FENTANYL 12 MCG/HR TD PT72: 25.0000 ug | MEDICATED_PATCH | TRANSDERMAL | Status: DC

## 2015-05-27 NOTE — Patient Instructions (Signed)
Nivolumab injection What is this medicine? NIVOLUMAB (nye VOL ue mab) is used to treat certain types of melanoma and lung cancer. This medicine may be used for other purposes; ask your health care provider or pharmacist if you have questions. COMMON BRAND NAME(S): Opdivo What should I tell my health care provider before I take this medicine? They need to know if you have any of these conditions: -eye disease, vision problems -history of pancreatitis -immune system problems -inflammatory bowel disease -kidney disease -liver disease -lung disease -lupus -myasthenia gravis -multiple sclerosis -organ transplant -stomach or intestine problems -thyroid disease -tingling of the fingers or toes, or other nerve disorder -an unusual or allergic reaction to nivolumab, other medicines, foods, dyes, or preservatives -pregnant or trying to get pregnant -breast-feeding How should I use this medicine? This medicine is for infusion into a vein. It is given by a health care professional in a hospital or clinic setting. A special MedGuide will be given to you before each treatment. Be sure to read this information carefully each time. Talk to your pediatrician regarding the use of this medicine in children. Special care may be needed. Overdosage: If you think you've taken too much of this medicine contact a poison control center or emergency room at once. Overdosage: If you think you have taken too much of this medicine contact a poison control center or emergency room at once. NOTE: This medicine is only for you. Do not share this medicine with others. What if I miss a dose? It is important not to miss your dose. Call your doctor or health care professional if you are unable to keep an appointment. What may interact with this medicine? Interactions have not been studied. This list may not describe all possible interactions. Give your health care provider a list of all the medicines, herbs,  non-prescription drugs, or dietary supplements you use. Also tell them if you smoke, drink alcohol, or use illegal drugs. Some items may interact with your medicine. What should I watch for while using this medicine? Tell your doctor or healthcare professional if your symptoms do not start to get better or if they get worse. Your condition will be monitored carefully while you are receiving this medicine. You may need blood work done while you are taking this medicine. What side effects may I notice from receiving this medicine? Side effects that you should report to your doctor or health care professional as soon as possible: -allergic reactions like skin rash, itching or hives, swelling of the face, lips, or tongue -black, tarry stools -bloody or watery diarrhea -changes in vision -chills -cough -depressed mood -eye pain -feeling anxious -fever -general ill feeling or flu-like symptoms -hair loss -loss of appetite -low blood counts - this medicine may decrease the number of white blood cells, red blood cells and platelets. You may be at increased risk for infections and bleeding -pain, tingling, numbness in the hands or feet -redness, blistering, peeling or loosening of the skin, including inside the mouth -red pinpoint spots on skin -signs of decreased platelets or bleeding - bruising, pinpoint red spots on the skin, black, tarry stools, blood in the urine -signs of decreased red blood cells - unusually weak or tired, feeling faint or lightheaded, falls -signs of infection - fever or chills, cough, sore throat, pain or trouble passing urine -signs and symptoms of a dangerous change in heartbeat or heart rhythm like chest pain; dizziness; fast or irregular heartbeat; palpitations; feeling faint or lightheaded, falls; breathing problems -signs   and symptoms of high blood sugar such as dizziness; dry mouth; dry skin; fruity breath; nausea; stomach pain; increased hunger or thirst; increased  urination -signs and symptoms of kidney injury like trouble passing urine or change in the amount of urine -signs and symptoms of liver injury like dark yellow or brown urine; general ill feeling or flu-like symptoms; light-colored stools; loss of appetite; nausea; right upper belly pain; unusually weak or tired; yellowing of the eyes or skin -signs and symptoms of increased potassium like muscle weakness; chest pain; or fast, irregular heartbeat -signs and symptoms of low potassium like muscle cramps or muscle pain; chest pain; dizziness; feeling faint or lightheaded, falls; palpitations; breathing problems; or fast, irregular heartbeat -swelling of the ankles, feet, hands -weight gainSide effects that usually do not require medical attention (report to your doctor or health care professional if they continue or are bothersome): -constipation -general ill feeling or flu-like symptoms -hair loss -loss of appetite -nausea, vomiting This list may not describe all possible side effects. Call your doctor for medical advice about side effects. You may report side effects to FDA at 1-800-FDA-1088. Where should I keep my medicine? This drug is given in a hospital or clinic and will not be stored at home. NOTE: This sheet is a summary. It may not cover all possible information. If you have questions about this medicine, talk to your doctor, pharmacist, or health care provider.  2015, [employer]. (2014-01-25 13:18:19)  

## 2015-05-31 ENCOUNTER — Ambulatory Visit: Payer: Self-pay

## 2015-06-01 ENCOUNTER — Ambulatory Visit: Payer: Self-pay

## 2015-06-01 ENCOUNTER — Other Ambulatory Visit: Payer: Self-pay

## 2015-06-02 ENCOUNTER — Ambulatory Visit: Payer: Self-pay
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
## 2015-06-08 ENCOUNTER — Other Ambulatory Visit: Payer: Self-pay | Admitting: Internal Medicine

## 2015-06-08 ENCOUNTER — Inpatient Hospital Stay: Payer: Medicare Other

## 2015-06-08 DIAGNOSIS — C3411 Malignant neoplasm of upper lobe, right bronchus or lung: Secondary | ICD-10-CM

## 2015-06-08 MED ORDER — FENTANYL 25 MCG/HR TD PT72: 25.0000 ug | MEDICATED_PATCH | TRANSDERMAL | Status: DC

## 2015-06-15 ENCOUNTER — Other Ambulatory Visit: Payer: Self-pay

## 2015-06-15 ENCOUNTER — Ambulatory Visit: Payer: Self-pay

## 2015-06-22 ENCOUNTER — Inpatient Hospital Stay: Payer: Medicare Other

## 2015-06-22 ENCOUNTER — Inpatient Hospital Stay: Payer: Medicare Other | Attending: Internal Medicine

## 2015-06-29 ENCOUNTER — Ambulatory Visit: Payer: Self-pay

## 2015-06-29 ENCOUNTER — Ambulatory Visit: Payer: Self-pay | Admitting: Internal Medicine

## 2015-06-29 ENCOUNTER — Other Ambulatory Visit: Payer: Self-pay

## 2015-07-06 ENCOUNTER — Inpatient Hospital Stay: Payer: Medicare Other | Attending: Internal Medicine

## 2015-07-06 ENCOUNTER — Inpatient Hospital Stay (HOSPITAL_BASED_OUTPATIENT_CLINIC_OR_DEPARTMENT_OTHER): Payer: Medicare Other | Admitting: Internal Medicine

## 2015-07-06 ENCOUNTER — Inpatient Hospital Stay: Payer: Medicare Other

## 2015-07-06 VITALS — BP 92/54 | HR 72 | Temp 99.2°F | Resp 18 | Ht 60.24 in | Wt 142.4 lb

## 2015-07-06 DIAGNOSIS — M549 Dorsalgia, unspecified: Secondary | ICD-10-CM | POA: Insufficient documentation

## 2015-07-06 DIAGNOSIS — I341 Nonrheumatic mitral (valve) prolapse: Secondary | ICD-10-CM | POA: Diagnosis not present

## 2015-07-06 DIAGNOSIS — I10 Essential (primary) hypertension: Secondary | ICD-10-CM | POA: Diagnosis not present

## 2015-07-06 DIAGNOSIS — G8929 Other chronic pain: Secondary | ICD-10-CM | POA: Diagnosis not present

## 2015-07-06 DIAGNOSIS — I714 Abdominal aortic aneurysm, without rupture: Secondary | ICD-10-CM

## 2015-07-06 DIAGNOSIS — K222 Esophageal obstruction: Secondary | ICD-10-CM | POA: Insufficient documentation

## 2015-07-06 DIAGNOSIS — I2510 Atherosclerotic heart disease of native coronary artery without angina pectoris: Secondary | ICD-10-CM

## 2015-07-06 DIAGNOSIS — R531 Weakness: Secondary | ICD-10-CM | POA: Diagnosis not present

## 2015-07-06 DIAGNOSIS — I509 Heart failure, unspecified: Secondary | ICD-10-CM | POA: Insufficient documentation

## 2015-07-06 DIAGNOSIS — Z87891 Personal history of nicotine dependence: Secondary | ICD-10-CM | POA: Diagnosis not present

## 2015-07-06 DIAGNOSIS — Z79899 Other long term (current) drug therapy: Secondary | ICD-10-CM | POA: Diagnosis not present

## 2015-07-06 DIAGNOSIS — J449 Chronic obstructive pulmonary disease, unspecified: Secondary | ICD-10-CM | POA: Insufficient documentation

## 2015-07-06 DIAGNOSIS — C3411 Malignant neoplasm of upper lobe, right bronchus or lung: Secondary | ICD-10-CM

## 2015-07-06 DIAGNOSIS — G4730 Sleep apnea, unspecified: Secondary | ICD-10-CM | POA: Insufficient documentation

## 2015-07-06 MED ORDER — FENTANYL 25 MCG/HR TD PT72: 25.0000 ug | MEDICATED_PATCH | TRANSDERMAL | Status: DC

## 2015-07-06 NOTE — Progress Notes (Signed)
Patient is here for follow-up. She has decided that she is not going to take the opdivo treatment. Patient states that overall she has been feeling pretty good. She states that the fentanyl patch has been helping a lot with her pain. She also states that she has been drinking Ensure and it has helped a lot with her appetite.
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
## 2015-08-08 ENCOUNTER — Telehealth: Payer: Self-pay | Admitting: *Deleted

## 2015-08-08 ENCOUNTER — Other Ambulatory Visit: Payer: Self-pay | Admitting: Internal Medicine

## 2015-08-08 DIAGNOSIS — C3411 Malignant neoplasm of upper lobe, right bronchus or lung: Secondary | ICD-10-CM

## 2015-08-08 MED ORDER — FENTANYL 12 MCG/HR TD PT72: MEDICATED_PATCH | TRANSDERMAL | Status: DC

## 2015-08-08 MED ORDER — FENTANYL 25 MCG/HR TD PT72: MEDICATED_PATCH | TRANSDERMAL | Status: DC

## 2015-08-08 NOTE — Telephone Encounter (Signed)
rx faxed and left vm for [facility] regarding increase in dose

## 2015-08-08 NOTE — Telephone Encounter (Signed)
Increase Fentanyl dose to 37 mcg/hr q72 hours, will give scripts for 25 mcg and 12 mcg patches. Thanks.

## 2015-08-08 NOTE — Telephone Encounter (Signed)
Asking for increase in Fenatanyl patch for uncontrolled pain past 2 weeks. Currently on 25 mcg

## 2015-08-09 ENCOUNTER — Emergency Department
Admission: EM | Admit: 2015-08-09 | Discharge: 2015-08-09 | Disposition: A | Discharge status: Home / Self Care | Attending: Emergency Medicine | Admitting: Emergency Medicine

## 2015-08-09 ENCOUNTER — Emergency Department

## 2015-08-09 ENCOUNTER — Encounter: Payer: Self-pay | Admitting: Emergency Medicine

## 2015-08-09 DIAGNOSIS — Y9389 Activity, other specified: Secondary | ICD-10-CM | POA: Diagnosis not present

## 2015-08-09 DIAGNOSIS — I10 Essential (primary) hypertension: Secondary | ICD-10-CM | POA: Insufficient documentation

## 2015-08-09 DIAGNOSIS — Z7951 Long term (current) use of inhaled steroids: Secondary | ICD-10-CM | POA: Insufficient documentation

## 2015-08-09 DIAGNOSIS — Z79899 Other long term (current) drug therapy: Secondary | ICD-10-CM | POA: Insufficient documentation

## 2015-08-09 DIAGNOSIS — Y998 Other external cause status: Secondary | ICD-10-CM | POA: Insufficient documentation

## 2015-08-09 DIAGNOSIS — Z880 Allergy status to penicillin: Secondary | ICD-10-CM | POA: Insufficient documentation

## 2015-08-09 DIAGNOSIS — M25512 Pain in left shoulder: Secondary | ICD-10-CM

## 2015-08-09 DIAGNOSIS — W1839XA Other fall on same level, initial encounter: Secondary | ICD-10-CM | POA: Insufficient documentation

## 2015-08-09 DIAGNOSIS — Y9289 Other specified places as the place of occurrence of the external cause: Secondary | ICD-10-CM | POA: Diagnosis not present

## 2015-08-09 DIAGNOSIS — Z87891 Personal history of nicotine dependence: Secondary | ICD-10-CM | POA: Diagnosis not present

## 2015-08-09 DIAGNOSIS — Z9889 Other specified postprocedural states: Secondary | ICD-10-CM | POA: Insufficient documentation

## 2015-08-09 DIAGNOSIS — S4992XA Unspecified injury of left shoulder and upper arm, initial encounter: Secondary | ICD-10-CM | POA: Diagnosis present

## 2015-08-09 MED ORDER — TRAMADOL HCL 50 MG PO TABS: 50.0000 mg | ORAL_TABLET | Freq: Four times a day (QID) | ORAL | Status: DC | PRN

## 2015-08-09 NOTE — ED Notes (Signed)
States she took a fall couple of days ago  Landed on left side . States pain is from neck into left arm  No deformity noted
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
## 2015-08-09 NOTE — Discharge Instructions (Signed)
Arthritis, Nonspecific °Arthritis is inflammation of a joint. This usually means pain, redness, warmth or swelling are present. One or more joints may be involved. There are a number of types of arthritis. Your caregiver may not be able to tell what type of arthritis you have right away. °CAUSES  °The most common cause of arthritis is the wear and tear on the joint (osteoarthritis). This causes damage to the cartilage, which can break down over time. The knees, hips, back and neck are most often affected by this type of arthritis. °Other types of arthritis and common causes of joint pain include: °· Sprains and other injuries near the joint. Sometimes minor sprains and injuries cause pain and swelling that develop hours later. °· Rheumatoid arthritis. This affects hands, feet and knees. It usually affects both sides of your body at the same time. It is often associated with chronic ailments, fever, weight loss and general weakness. °· Crystal arthritis. Gout and pseudo gout can cause occasional acute severe pain, redness and swelling in the foot, ankle, or knee. °· Infectious arthritis. Bacteria can get into a joint through a break in overlying skin. This can cause infection of the joint. Bacteria and viruses can also spread through the blood and affect your joints. °· Drug, infectious and allergy reactions. Sometimes joints can become mildly painful and slightly swollen with these types of illnesses. °SYMPTOMS  °· Pain is the main symptom. °· Your joint or joints can also be red, swollen and warm or hot to the touch. °· You may have a fever with certain types of arthritis, or even feel overall ill. °· The joint with arthritis will hurt with movement. Stiffness is present with some types of arthritis. °DIAGNOSIS  °Your caregiver will suspect arthritis based on your description of your symptoms and on your exam. Testing may be needed to find the type of arthritis: °· Blood and sometimes urine tests. °· X-ray tests  and sometimes CT or MRI scans. °· Removal of fluid from the joint (arthrocentesis) is done to check for bacteria, crystals or other causes. Your caregiver (or a specialist) will numb the area over the joint with a local anesthetic, and use a needle to remove joint fluid for examination. This procedure is only minimally uncomfortable. °· Even with these tests, your caregiver may not be able to tell what kind of arthritis you have. Consultation with a specialist (rheumatologist) may be helpful. °TREATMENT  °Your caregiver will discuss with you treatment specific to your type of arthritis. If the specific type cannot be determined, then the following general recommendations may apply. °Treatment of severe joint pain includes: °· Rest. °· Elevation. °· Anti-inflammatory medication (for example, ibuprofen) may be prescribed. Avoiding activities that cause increased pain. °· Only take over-the-counter or prescription medicines for pain and discomfort as recommended by your caregiver. °· Cold packs over an inflamed joint may be used for 10 to 15 minutes every hour. Hot packs sometimes feel better, but do not use overnight. Do not use hot packs if you are diabetic without your caregiver's permission. °· A cortisone shot into arthritic joints may help reduce pain and swelling. °· Any acute arthritis that gets worse over the next 1 to 2 days needs to be looked at to be sure there is no joint infection. °Long-term arthritis treatment involves modifying activities and lifestyle to reduce joint stress jarring. This can include weight loss. Also, exercise is needed to nourish the joint cartilage and remove waste. This helps keep the muscles   around the joint strong. °HOME CARE INSTRUCTIONS  °· Do not take aspirin to relieve pain if gout is suspected. This elevates uric acid levels. °· Only take over-the-counter or prescription medicines for pain, discomfort or fever as directed by your caregiver. °· Rest the joint as much as  possible. °· If your joint is swollen, keep it elevated. °· Use crutches if the painful joint is in your leg. °· Drinking plenty of fluids may help for certain types of arthritis. °· Follow your caregiver's dietary instructions. °· Try low-impact exercise such as: °¨ Swimming. °¨ Water aerobics. °¨ Biking. °¨ Walking. °· Morning stiffness is often relieved by a warm shower. °· Put your joints through regular range-of-motion. °SEEK MEDICAL CARE IF:  °· You do not feel better in 24 hours or are getting worse. °· You have side effects to medications, or are not getting better with treatment. °SEEK IMMEDIATE MEDICAL CARE IF:  °· You have a fever. °· You develop severe joint pain, swelling or redness. °· Many joints are involved and become painful and swollen. °· There is severe back pain and/or leg weakness. °· You have loss of bowel or bladder control. °Document Released: 12/13/2004 Document Revised: 01/28/2012 Document Reviewed: 12/29/2008 °[employer]® Patient Information ©2015 [employer]. This information is not intended to replace advice given to you by your health care provider. Make sure you discuss any questions you have with your health care provider. ° °

## 2015-08-09 NOTE — Progress Notes (Signed)
ED visit made. Patient is followed at home by Hospice and [facility] with a hospice diagnosis of Lung Ca, she is a DNR. Patient to the Ed today via [facility] with her daughter for evaluation of left shoulder pain. She reported a fall to her hospice nurse yesterday, shew as unable to say exactly when she fell. Of note for pain management at home her fentanyl patch was just increased last evening to 37 mcg. She had been given 3 doses of liquid morphine 5 mg each and also a lorazepam 0.5 mg tab prior to her arrival in the ED. Patient seen sitting up in the wheel chair, daughter Webb Silversmith present. Patient animated and interactive. Reports her pain was "better than before".  Shoulder xray clear, no fractures or tumor noted per report of ED physician Dr. Corky Downs. Pain control was discussed with patient and her daughter as well as Dr. Corky Downs. She currently  Has tylenol ordered for break through pain. Patient was going to be discharged with an ultram prescription, but after writer's discussion with patient's daughter it was felt that the patient was unable to manage the pain medication herself. Hospice team notified of planned d/c. Webb Silversmith stated her awareness that it is becoming difficult for her mother to remains home alone and that a plan would need to be worked out regarding her care/supervision. Hospice SW made aware. Patient to discharge via car back home. Flo Shanks RN, BSN, [place] [facility] [place], [facility] [phone] c

## 2015-08-09 NOTE — ED Notes (Signed)
Pt fell 2 days ago, c/o bilateral arm pain, left worse than right. Pt is hospice patient, lung cancer patient. Reports normal pain medications aren't helping.

## 2015-08-23 ENCOUNTER — Telehealth: Payer: Self-pay | Admitting: *Deleted

## 2015-08-23 DIAGNOSIS — C3411 Malignant neoplasm of upper lobe, right bronchus or lung: Secondary | ICD-10-CM

## 2015-08-23 MED ORDER — FENTANYL 12 MCG/HR TD PT72: MEDICATED_PATCH | TRANSDERMAL | Status: DC

## 2015-08-23 MED ORDER — RISPERIDONE 0.25 MG PO TABS: 0.2500 mg | ORAL_TABLET | Freq: Two times a day (BID) | ORAL | Status: AC

## 2015-08-23 MED ORDER — FENTANYL 25 MCG/HR TD PT72: MEDICATED_PATCH | TRANSDERMAL | Status: DC

## 2015-08-23 NOTE — Telephone Encounter (Signed)
meds sent to pharmacy and Helene Kelp notified

## 2015-08-23 NOTE — Telephone Encounter (Addendum)
Needs fentanyl 25 and 12 mcg refilled and her dementia is getting worse, Hospice has spoken with Dr Luvenia Heller who recommends Riserdal 0.25 mg bid and they need your approval for this

## 2015-09-07 ENCOUNTER — Telehealth: Payer: Self-pay | Admitting: *Deleted

## 2015-09-07 DIAGNOSIS — C3411 Malignant neoplasm of upper lobe, right bronchus or lung: Secondary | ICD-10-CM

## 2015-09-07 MED ORDER — FENTANYL 25 MCG/HR TD PT72: MEDICATED_PATCH | TRANSDERMAL | Status: DC

## 2015-09-07 MED ORDER — FENTANYL 12 MCG/HR TD PT72: MEDICATED_PATCH | TRANSDERMAL | Status: DC

## 2015-09-07 NOTE — Telephone Encounter (Signed)
To [facility] [employer]

## 2015-09-07 NOTE — Telephone Encounter (Signed)
Gail Swanson notified

## 2015-09-26 ENCOUNTER — Telehealth: Payer: Self-pay | Admitting: *Deleted

## 2015-09-26 DIAGNOSIS — C3411 Malignant neoplasm of upper lobe, right bronchus or lung: Secondary | ICD-10-CM

## 2015-09-26 MED ORDER — FENTANYL 12 MCG/HR TD PT72: MEDICATED_PATCH | TRANSDERMAL | Status: DC

## 2015-09-26 MED ORDER — FENTANYL 25 MCG/HR TD PT72: MEDICATED_PATCH | TRANSDERMAL | Status: DC

## 2015-09-26 NOTE — Telephone Encounter (Signed)
Faxed

## 2015-11-15 ENCOUNTER — Other Ambulatory Visit: Payer: Self-pay | Admitting: Nurse Practitioner

## 2016-01-24 ENCOUNTER — Emergency Department (HOSPITAL_COMMUNITY)

## 2016-01-24 ENCOUNTER — Encounter (HOSPITAL_COMMUNITY): Payer: Self-pay | Admitting: Emergency Medicine

## 2016-01-24 ENCOUNTER — Emergency Department (HOSPITAL_COMMUNITY)
Admission: EM | Admit: 2016-01-24 | Discharge: 2016-01-24 | Disposition: A | Discharge status: Home / Self Care | Attending: Emergency Medicine | Admitting: Emergency Medicine

## 2016-01-24 DIAGNOSIS — Z85118 Personal history of other malignant neoplasm of bronchus and lung: Secondary | ICD-10-CM | POA: Diagnosis not present

## 2016-01-24 DIAGNOSIS — J441 Chronic obstructive pulmonary disease with (acute) exacerbation: Secondary | ICD-10-CM | POA: Diagnosis not present

## 2016-01-24 DIAGNOSIS — Z880 Allergy status to penicillin: Secondary | ICD-10-CM | POA: Diagnosis not present

## 2016-01-24 DIAGNOSIS — G4730 Sleep apnea, unspecified: Secondary | ICD-10-CM | POA: Insufficient documentation

## 2016-01-24 DIAGNOSIS — Z79899 Other long term (current) drug therapy: Secondary | ICD-10-CM | POA: Insufficient documentation

## 2016-01-24 DIAGNOSIS — J3489 Other specified disorders of nose and nasal sinuses: Secondary | ICD-10-CM | POA: Diagnosis not present

## 2016-01-24 DIAGNOSIS — G8929 Other chronic pain: Secondary | ICD-10-CM | POA: Diagnosis not present

## 2016-01-24 DIAGNOSIS — R0600 Dyspnea, unspecified: Secondary | ICD-10-CM | POA: Insufficient documentation

## 2016-01-24 DIAGNOSIS — I2510 Atherosclerotic heart disease of native coronary artery without angina pectoris: Secondary | ICD-10-CM | POA: Diagnosis not present

## 2016-01-24 DIAGNOSIS — I10 Essential (primary) hypertension: Secondary | ICD-10-CM | POA: Insufficient documentation

## 2016-01-24 DIAGNOSIS — R0602 Shortness of breath: Secondary | ICD-10-CM | POA: Diagnosis present

## 2016-01-24 DIAGNOSIS — Z87891 Personal history of nicotine dependence: Secondary | ICD-10-CM | POA: Diagnosis not present

## 2016-01-24 DIAGNOSIS — R197 Diarrhea, unspecified: Secondary | ICD-10-CM | POA: Diagnosis not present

## 2016-01-24 DIAGNOSIS — R05 Cough: Secondary | ICD-10-CM | POA: Diagnosis not present

## 2016-01-24 LAB — CBC
HCT: 28.3 % — ABNORMAL LOW (ref 36.0–46.0)
Hemoglobin: 8.1 g/dL — ABNORMAL LOW (ref 12.0–15.0)
MCH: 21.3 pg — AB (ref 26.0–34.0)
MCHC: 28.6 g/dL — AB (ref 30.0–36.0)
MCV: 74.5 fL — AB (ref 78.0–100.0)
PLATELETS: 259 10*3/uL (ref 150–400)
RBC: 3.8 MIL/uL — ABNORMAL LOW (ref 3.87–5.11)
RDW: 18.2 % — AB (ref 11.5–15.5)
WBC: 8.2 10*3/uL (ref 4.0–10.5)

## 2016-01-24 LAB — COMPREHENSIVE METABOLIC PANEL
ALT: 7 U/L — ABNORMAL LOW (ref 14–54)
ANION GAP: 10 (ref 5–15)
AST: 10 U/L — ABNORMAL LOW (ref 15–41)
Albumin: 2.7 g/dL — ABNORMAL LOW (ref 3.5–5.0)
Alkaline Phosphatase: 79 U/L (ref 38–126)
BUN: 14 mg/dL (ref 6–20)
CHLORIDE: 101 mmol/L (ref 101–111)
CO2: 27 mmol/L (ref 22–32)
Calcium: 9 mg/dL (ref 8.9–10.3)
Creatinine, Ser: 0.47 mg/dL (ref 0.44–1.00)
Glucose, Bld: 93 mg/dL (ref 65–99)
Potassium: 4.2 mmol/L (ref 3.5–5.1)
SODIUM: 138 mmol/L (ref 135–145)
Total Bilirubin: 0.7 mg/dL (ref 0.3–1.2)
Total Protein: 7.2 g/dL (ref 6.5–8.1)

## 2016-01-24 LAB — DIFFERENTIAL
Basophils Absolute: 0 10*3/uL (ref 0.0–0.1)
Basophils Relative: 0 %
EOS ABS: 0 10*3/uL (ref 0.0–0.7)
EOS PCT: 0 %
LYMPHS ABS: 0.5 10*3/uL — AB (ref 0.7–4.0)
Lymphocytes Relative: 8 %
MONOS PCT: 9 %
Monocytes Absolute: 0.6 10*3/uL (ref 0.1–1.0)
Neutro Abs: 5.9 10*3/uL (ref 1.7–7.7)
Neutrophils Relative %: 83 %

## 2016-01-24 LAB — BRAIN NATRIURETIC PEPTIDE: B Natriuretic Peptide: 85.3 pg/mL (ref 0.0–100.0)

## 2016-01-24 LAB — POC OCCULT BLOOD, ED: Fecal Occult Bld: NEGATIVE

## 2016-01-24 MED ORDER — ALBUTEROL SULFATE HFA 108 (90 BASE) MCG/ACT IN AERS: 1.0000 | INHALATION_SPRAY | RESPIRATORY_TRACT | Status: AC | PRN

## 2016-01-24 MED ORDER — IPRATROPIUM-ALBUTEROL 0.5-2.5 (3) MG/3ML IN SOLN
3.0000 mL | RESPIRATORY_TRACT | Status: DC
  Administered 2016-01-24 (×2): 3 mL via RESPIRATORY_TRACT
  Filled 2016-01-24 (×2): qty 3

## 2016-01-24 NOTE — ED Notes (Signed)
Bed: AQ76 Expected date:  Expected time:  Means of arrival:  Comments: EMS- elderly pt, respiratory distress- lung CA

## 2016-01-24 NOTE — ED Notes (Signed)
Per EMS, patient from [facility] complains of difficulty breathing since 1100.  O2 saturation was 90% upon EMS arrival.  Patient was suctioned and placed on 2 lt [place].  O2 sats improved to 98%.  EMS states that patient had thick white secretions when suctioning mouth.  Patient has 3/10 chest pain.  EMS 12 lead unremarkable.
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
## 2016-01-28 ENCOUNTER — Observation Stay (HOSPITAL_COMMUNITY)
Admission: EM | Admit: 2016-01-28 | Discharge: 2016-02-18 | Disposition: E | Attending: Internal Medicine | Admitting: Internal Medicine

## 2016-01-28 ENCOUNTER — Emergency Department (HOSPITAL_COMMUNITY)

## 2016-01-28 ENCOUNTER — Encounter (HOSPITAL_COMMUNITY): Payer: Self-pay | Admitting: *Deleted

## 2016-01-28 DIAGNOSIS — Y95 Nosocomial condition: Secondary | ICD-10-CM | POA: Diagnosis not present

## 2016-01-28 DIAGNOSIS — I110 Hypertensive heart disease with heart failure: Secondary | ICD-10-CM | POA: Insufficient documentation

## 2016-01-28 DIAGNOSIS — J9601 Acute respiratory failure with hypoxia: Secondary | ICD-10-CM | POA: Diagnosis not present

## 2016-01-28 DIAGNOSIS — D509 Iron deficiency anemia, unspecified: Secondary | ICD-10-CM | POA: Diagnosis not present

## 2016-01-28 DIAGNOSIS — I959 Hypotension, unspecified: Secondary | ICD-10-CM | POA: Insufficient documentation

## 2016-01-28 DIAGNOSIS — G8929 Other chronic pain: Secondary | ICD-10-CM | POA: Insufficient documentation

## 2016-01-28 DIAGNOSIS — R0600 Dyspnea, unspecified: Secondary | ICD-10-CM

## 2016-01-28 DIAGNOSIS — I509 Heart failure, unspecified: Secondary | ICD-10-CM | POA: Diagnosis not present

## 2016-01-28 DIAGNOSIS — J189 Pneumonia, unspecified organism: Secondary | ICD-10-CM | POA: Diagnosis not present

## 2016-01-28 DIAGNOSIS — J449 Chronic obstructive pulmonary disease, unspecified: Secondary | ICD-10-CM | POA: Insufficient documentation

## 2016-01-28 DIAGNOSIS — Z66 Do not resuscitate: Secondary | ICD-10-CM | POA: Diagnosis not present

## 2016-01-28 DIAGNOSIS — Z87891 Personal history of nicotine dependence: Secondary | ICD-10-CM | POA: Insufficient documentation

## 2016-01-28 DIAGNOSIS — F419 Anxiety disorder, unspecified: Secondary | ICD-10-CM

## 2016-01-28 DIAGNOSIS — M549 Dorsalgia, unspecified: Secondary | ICD-10-CM | POA: Insufficient documentation

## 2016-01-28 DIAGNOSIS — Z515 Encounter for palliative care: Secondary | ICD-10-CM | POA: Diagnosis not present

## 2016-01-28 DIAGNOSIS — A419 Sepsis, unspecified organism: Secondary | ICD-10-CM | POA: Insufficient documentation

## 2016-01-28 DIAGNOSIS — Z96652 Presence of left artificial knee joint: Secondary | ICD-10-CM | POA: Insufficient documentation

## 2016-01-28 DIAGNOSIS — Z923 Personal history of irradiation: Secondary | ICD-10-CM | POA: Diagnosis not present

## 2016-01-28 DIAGNOSIS — R0603 Acute respiratory distress: Secondary | ICD-10-CM

## 2016-01-28 DIAGNOSIS — J690 Pneumonitis due to inhalation of food and vomit: Secondary | ICD-10-CM | POA: Insufficient documentation

## 2016-01-28 DIAGNOSIS — F0390 Unspecified dementia without behavioral disturbance: Secondary | ICD-10-CM | POA: Diagnosis present

## 2016-01-28 DIAGNOSIS — Z85118 Personal history of other malignant neoplasm of bronchus and lung: Secondary | ICD-10-CM | POA: Diagnosis not present

## 2016-01-28 DIAGNOSIS — C3490 Malignant neoplasm of unspecified part of unspecified bronchus or lung: Secondary | ICD-10-CM | POA: Diagnosis present

## 2016-01-28 HISTORY — DX: Unspecified dementia, unspecified severity, without behavioral disturbance, psychotic disturbance, mood disturbance, and anxiety: F03.90

## 2016-01-28 LAB — CBC WITH DIFFERENTIAL/PLATELET
BASOS PCT: 0 %
Basophils Absolute: 0 10*3/uL (ref 0.0–0.1)
Eosinophils Absolute: 0 10*3/uL (ref 0.0–0.7)
Eosinophils Relative: 0 %
HEMATOCRIT: 30.9 % — AB (ref 36.0–46.0)
HEMOGLOBIN: 8.6 g/dL — AB (ref 12.0–15.0)
Lymphocytes Relative: 4 %
Lymphs Abs: 0.6 10*3/uL — ABNORMAL LOW (ref 0.7–4.0)
MCH: 20.8 pg — ABNORMAL LOW (ref 26.0–34.0)
MCHC: 27.8 g/dL — ABNORMAL LOW (ref 30.0–36.0)
MCV: 74.6 fL — AB (ref 78.0–100.0)
MONO ABS: 0.6 10*3/uL (ref 0.1–1.0)
Monocytes Relative: 4 %
NEUTROS PCT: 92 %
Neutro Abs: 14.7 10*3/uL — ABNORMAL HIGH (ref 1.7–7.7)
Platelets: 221 10*3/uL (ref 150–400)
RBC: 4.14 MIL/uL (ref 3.87–5.11)
RDW: 18.5 % — AB (ref 11.5–15.5)
Smear Review: ADEQUATE
WBC: 15.9 10*3/uL — AB (ref 4.0–10.5)

## 2016-01-28 LAB — BASIC METABOLIC PANEL
Anion gap: 12 (ref 5–15)
BUN: 12 mg/dL (ref 6–20)
CALCIUM: 9.1 mg/dL (ref 8.9–10.3)
CO2: 26 mmol/L (ref 22–32)
CREATININE: 0.6 mg/dL (ref 0.44–1.00)
Chloride: 95 mmol/L — ABNORMAL LOW (ref 101–111)
GFR calc non Af Amer: >60 mL/min (ref >60)
Glucose, Bld: 201 mg/dL — ABNORMAL HIGH (ref 65–99)
Potassium: 4.1 mmol/L (ref 3.5–5.1)
SODIUM: 133 mmol/L — AB (ref 135–145)

## 2016-01-28 LAB — URINALYSIS, ROUTINE W REFLEX MICROSCOPIC
BILIRUBIN URINE: NEGATIVE
Glucose, UA: NEGATIVE mg/dL
KETONES UR: NEGATIVE mg/dL
NITRITE: POSITIVE — AB
Protein, ur: 30 mg/dL — AB
Specific Gravity, Urine: >1.046 — ABNORMAL HIGH (ref 1.005–1.030)
pH: 6 (ref 5.0–8.0)

## 2016-01-28 LAB — I-STAT ARTERIAL BLOOD GAS, ED
ACID-BASE EXCESS: 2 mmol/L (ref 0.0–2.0)
Bicarbonate: 27 mEq/L — ABNORMAL HIGH (ref 20.0–24.0)
O2 SAT: 99 %
PH ART: 7.395 (ref 7.350–7.450)
PO2 ART: 133 mmHg — AB (ref 80.0–100.0)
TCO2: 28 mmol/L (ref 0–100)
pCO2 arterial: 44.1 mmHg (ref 35.0–45.0)

## 2016-01-28 LAB — I-STAT CG4 LACTIC ACID, ED: Lactic Acid, Venous: 2.64 mmol/L (ref 0.5–2.0)

## 2016-01-28 LAB — URINE MICROSCOPIC-ADD ON

## 2016-01-28 LAB — I-STAT TROPONIN, ED: TROPONIN I, POC: 0.16 ng/mL — AB (ref 0.00–0.08)

## 2016-01-28 LAB — BRAIN NATRIURETIC PEPTIDE: B NATRIURETIC PEPTIDE 5: 121.1 pg/mL — AB (ref 0.0–100.0)

## 2016-01-28 MED ORDER — SODIUM CHLORIDE 0.9 % IV SOLN
INTRAVENOUS | Status: DC
Start: 2016-01-28 — End: 2016-01-29

## 2016-01-28 MED ORDER — FUROSEMIDE 10 MG/ML IJ SOLN
40.0000 mg | INTRAMUSCULAR | Status: DC
  Filled 2016-01-28: qty 4

## 2016-01-28 MED ORDER — GLYCOPYRROLATE 0.2 MG/ML IJ SOLN: 0.2000 mg | INTRAMUSCULAR | Status: DC | PRN

## 2016-01-28 MED ORDER — SODIUM CHLORIDE 0.9 % IV SOLN
2.0000 mg/h | INTRAVENOUS | Status: DC
  Administered 2016-01-28: 2 mg/h via INTRAVENOUS
  Filled 2016-01-28: qty 10

## 2016-01-28 MED ORDER — IOHEXOL 350 MG/ML SOLN
100.0000 mL | Freq: Once | INTRAVENOUS | Status: AC | PRN
  Administered 2016-01-28: 100 mL via INTRAVENOUS

## 2016-01-28 MED ORDER — ONDANSETRON HCL 4 MG PO TABS: 4.0000 mg | ORAL_TABLET | Freq: Four times a day (QID) | ORAL | Status: DC | PRN

## 2016-01-28 MED ORDER — ALBUTEROL SULFATE (2.5 MG/3ML) 0.083% IN NEBU: 2.5000 mg | INHALATION_SOLUTION | RESPIRATORY_TRACT | Status: DC | PRN

## 2016-01-28 MED ORDER — ACETAMINOPHEN 325 MG PO TABS: 650.0000 mg | ORAL_TABLET | Freq: Four times a day (QID) | ORAL | Status: DC | PRN

## 2016-01-28 MED ORDER — ONDANSETRON HCL 4 MG/2ML IJ SOLN: 4.0000 mg | Freq: Four times a day (QID) | INTRAMUSCULAR | Status: DC | PRN

## 2016-01-28 MED ORDER — LORAZEPAM 2 MG/ML IJ SOLN
1.0000 mg | Freq: Once | INTRAMUSCULAR | Status: AC
  Administered 2016-01-28: 1 mg via INTRAVENOUS
  Filled 2016-01-28: qty 1

## 2016-01-28 MED ORDER — METHYLPREDNISOLONE SODIUM SUCC 125 MG IJ SOLR
125.0000 mg | Freq: Once | INTRAMUSCULAR | Status: AC
  Administered 2016-01-28: 125 mg via INTRAVENOUS
  Filled 2016-01-28: qty 2

## 2016-01-28 MED ORDER — AZTREONAM 1 G IJ SOLR
1.0000 g | Freq: Three times a day (TID) | INTRAMUSCULAR | Status: DC
  Filled 2016-01-28: qty 1

## 2016-01-28 MED ORDER — DEXTROSE 5 % IV SOLN: 2.0000 g | Freq: Once | INTRAVENOUS | Status: DC

## 2016-01-28 MED ORDER — LORAZEPAM 2 MG/ML IJ SOLN
1.0000 mg | INTRAMUSCULAR | Status: DC | PRN
  Administered 2016-01-28: 1 mg via INTRAVENOUS
  Filled 2016-01-28: qty 1

## 2016-01-28 MED ORDER — SODIUM CHLORIDE 0.9 % IV BOLUS (SEPSIS)
500.0000 mL | Freq: Once | INTRAVENOUS | Status: AC
  Administered 2016-01-28: 500 mL via INTRAVENOUS

## 2016-01-28 MED ORDER — IPRATROPIUM-ALBUTEROL 0.5-2.5 (3) MG/3ML IN SOLN
3.0000 mL | Freq: Once | RESPIRATORY_TRACT | Status: AC
  Administered 2016-01-28: 3 mL via RESPIRATORY_TRACT
  Filled 2016-01-28: qty 3

## 2016-01-28 MED ORDER — GLYCOPYRROLATE 1 MG PO TABS
1.0000 mg | ORAL_TABLET | ORAL | Status: DC | PRN
  Filled 2016-01-28: qty 1

## 2016-01-28 MED ORDER — MORPHINE SULFATE (PF) 2 MG/ML IV SOLN
2.0000 mg | Freq: Once | INTRAVENOUS | Status: AC
  Administered 2016-01-28: 2 mg via INTRAVENOUS
  Filled 2016-01-28: qty 1

## 2016-01-28 MED ORDER — POLYVINYL ALCOHOL 1.4 % OP SOLN
1.0000 [drp] | Freq: Four times a day (QID) | OPHTHALMIC | Status: DC | PRN
  Filled 2016-01-28: qty 15

## 2016-01-28 MED ORDER — VANCOMYCIN HCL IN DEXTROSE 1-5 GM/200ML-% IV SOLN: 1000.0000 mg | Freq: Once | INTRAVENOUS | Status: DC

## 2016-01-28 MED ORDER — SODIUM CHLORIDE 0.9 % IV SOLN
500.0000 mg | Freq: Two times a day (BID) | INTRAVENOUS | Status: DC
Start: 2016-01-29 — End: 2016-01-28
  Filled 2016-01-28: qty 500

## 2016-01-28 MED ORDER — BIOTENE DRY MOUTH MT LIQD: 15.0000 mL | OROMUCOSAL | Status: DC | PRN

## 2016-01-28 MED ORDER — AZTREONAM 1 G IJ SOLR
1.0000 g | Freq: Three times a day (TID) | INTRAMUSCULAR | Status: DC
  Filled 2016-01-28 (×3): qty 1

## 2016-01-28 MED ORDER — ACETAMINOPHEN 650 MG RE SUPP: 650.0000 mg | Freq: Four times a day (QID) | RECTAL | Status: DC | PRN

## 2016-01-28 MED ORDER — MORPHINE BOLUS VIA INFUSION
2.0000 mg | INTRAVENOUS | Status: DC | PRN
  Filled 2016-01-28: qty 2

## 2016-01-30 LAB — URINE CULTURE
Culture: >=100000
SPECIAL REQUESTS: NORMAL

## 2016-02-02 LAB — CULTURE, BLOOD (ROUTINE X 2)
CULTURE: NO GROWTH
Culture: NO GROWTH

## 2016-02-18 NOTE — ED Notes (Signed)
Attempted report 

## 2016-02-18 NOTE — ED Notes (Signed)
Pt here from [facility] for respiratory distress.  Staff stated that pt was "not feeling well" this am.  She was last seen drinking milk and when staff returned to room pt was struggling to breathe.  Sats of 80% on  NRB per fire and 60% on C-pap per ems.

## 2016-02-18 NOTE — ED Notes (Signed)
Family has chosen comfort care per Dr Rex Kras.

## 2016-02-18 NOTE — Progress Notes (Signed)
Approximately 245 cc of morphine drip bag wasted and witnessed by Edison Simon, RN

## 2016-02-18 NOTE — ED Notes (Signed)
Bi-PaP removed per Dr [place] Pink.

## 2016-02-18 NOTE — ED Notes (Signed)
MD aware of pressures dropping.  Fluids started, lasix held.

## 2016-02-18 NOTE — ED Notes (Signed)
Pulmonary NP at bedside.
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
## 2016-02-18 NOTE — Progress Notes (Signed)
Pt transported to CT scan via bipap 100% fio2 w/ no apparent complications.  Pt back to ED, on 100% fio2, RN and NP at bedside.
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
## 2016-02-18 NOTE — Progress Notes (Signed)
   2016/02/22 1647  Clinical Encounter Type  Visited With Patient and family together;Health care provider  Visit Type Initial;Spiritual support;Critical Care;Patient actively dying;ED  Referral From Physician;Nurse;Palliative care team  Spiritual Encounters  Spiritual Needs Prayer;Emotional;Grief support  Stress Factors  Family Stress Factors Health changes;Loss   Chaplain responded to an end of life consult and patient actively dying. Chaplain offered support, hospitality, and prayer for the family. Chaplain services available as needed.   Jeri Lager, Chaplain Feb 22, 2016 4:49 PM
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
## 2016-02-18 NOTE — ED Notes (Signed)
Cancelled code sepsis.

## 2016-02-18 NOTE — Discharge Summary (Addendum)
   Death Summary  Gail Swanson UQJ:[id number] DOB: February 22, 1932 DOA: 02/09/2016  PCP: Glendon Axe, MD   Admit date: 09-Feb-2016 Date of Death: 2016-02-09  Final Diagnoses:  Principal Problem:   Acute respiratory failure with hypoxia ([place]) Active Problems:   Metastatic lung cancer (metastasis from lung to other site) [facility])   Healthcare-associated pneumonia   Dementia    History of present illness:  Gail Swanson is a 82 y.o. female with a past medical history of metastatic lung cancer under hospice care at a skilled nursing facility, history of dementia, COPD, who was apparently in her usual state of health until earlier today when she mentioned to the nursing facility staff that she was not feeling well. She was apparently last seen drinking milk and was struggling to breathe. Her saturations were 80%. EMS was called. Patient was transported to the emergency department. Patient is poorly responsive. No history is available from her. Her daughter and granddaughter at the bedside. History is extremely limited.  In the emergency department, patient was found to have leukocytosis. She underwent a CT scan of the chest was revealed worsening lung mass along with pneumonia.  Hospital Course:  #1 Acute respiratory failure with hypoxia: Most likely secondary to aspiration pneumonia. Patient was placed on BiPAP and saturations, however, remained in the 80s. She was poorly responsive. Considering her history of metastatic cancer, her prognosis was thought to be very poor. Family was aware. Patient was seen by palliative medicine. Proceeded with comfort care. No need for antibiotics. Once the other family members come in to visit the patient, BiPAP was removed. She passed away soon afterwards.   #2 Aspiration pneumonia with sepsis: Please see above.   #3 Metastatic lung cancer: She was under hospice services. She was not being actively treated. Please see above.  #4 abnormal, UA, suggesting UTI: See  above. Comfort measures.  #5 Microcytic anemia  #6 Mildly elevated troponin, likely secondary to demand ischemia.   Patient expired on 2016/02/09 at [facility] 01/29/2016, 7:05 AM

## 2016-02-18 NOTE — Progress Notes (Signed)
Patient was just received from ED accompanied by RN and family when she stopped breathing. No heart rate appreciated , no rise and fall of chest.  Confirmed by Lynden Ang  RN, . Pronounced dead at 02/20/1901 .

## 2016-02-18 DEATH — deceased

## 2016-08-18 IMAGING — CT NM PET TUM IMG RESTAG (PS) SKULL BASE T - THIGH
1 of 9 series · 1 of 25 positions shown · non-contrast
Comparison: Multiple exams, including 11/09/2014

CLINICAL DATA: Subsequent treatment strategy for non-small cell
right upper lobe lung cancer. Pain and weight loss..

EXAM:
NUCLEAR MEDICINE PET SKULL BASE TO THIGH
TECHNIQUE: 11.2 mCi F-18 FDG was injected intravenously. Full-ring PET imaging
was performed from the skull base to thigh after the radiotracer. CT
data was obtained and used for attenuation correction and anatomic
localization.
FASTING BLOOD GLUCOSE:  Value: 96 mg/dl

[Series 3: ct wb 5.0 b30f · axial · 5.0mm · 0.98mm/px · 1 of 290 slices shown]
[im 290/290  brain]
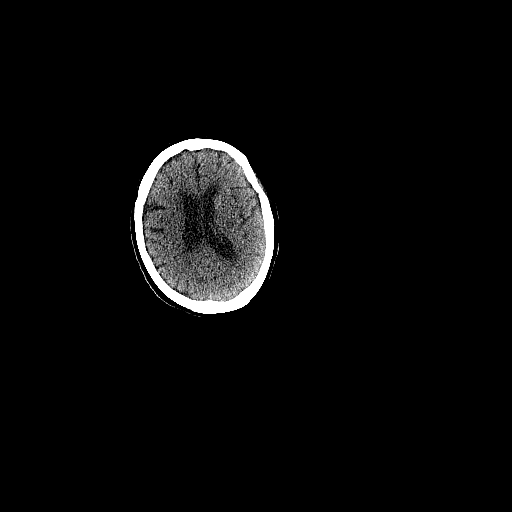

[1 of 25 positions shown; findings below may reference images not displayed]

FINDINGS: PLEASE NOTE: The patient demanded to terminate the exam half way
through PET imaging. Accordingly, PET data is only available for the
neck, chest, and upper abdomen, but not the pelvis or upper thigh
region. Also, there is a considerable amount of motion artifact and
misregistration because the patient was moving and talking during
the scan, especially in the head region where there is up to 10 cm
of lateral translation of the head during acquisition resulting in a
ghosting appearance. The technologist documents that the best images
possible were obtained.

NECK

No hypermetabolic lymph nodes in the neck. Chronic left maxillary
sinusitis. Right lower occipital craniotomy/craniectomy. Small mass
near the right porus acousticus, likely a meningioma.

CHEST

Right upper lobe mass measures 9.4 by 7.3 cm on image 72 of the CT
data (formerly 3.4 by 4.8 cm) and has a maximum standard uptake
value of 18.0 (formerly 14.0).

Faint foci of increased activity along right hilar and lower
paratracheal lymph nodes, SUV max measuring up to 3.8. A lower
paratracheal lymph node measures 1.2 cm in short axis, image 84
series 3 (formerly 1.0 cm by my measurements).

Underlying emphysema noted.

Anterior rib hypermetabolic activity associated with rib deformities
bilaterally in the anterior ribs, quite likely from rib fractures. A
right lower rib fracture has maximum standard uptake value 4.7.

Coronary, aortic arch, and branch vessel atherosclerotic vascular
disease.

ABDOMEN/PELVIS

The upper abdomen is included but the patient refused imaging of the
pelvis. PET images are obtained through most of the kidneys and
through the liver. These are severely degraded by motion artifact.

Aside from physiologic activity in bowel, I do not discern malignant
activity in the upper abdomen. There is an infrarenal abdominal
aortic aneurysm measuring 4.1 by 4.3 cm along with a small umbilical
hernia containing adipose tissue. Aortoiliac atherosclerotic
vascular disease.

In the pelvis on the CT data, no obvious adenopathy is observed.
Sigmoid diverticulosis is present.

SKELETON

No focal hypermetabolic activity to suggest skeletal metastasis.
Right iliac bone graft harvesting site noted with posterolateral rod
and pedicle screw fixation in the lower lumbar spine extending into
the upper pelvis. Multilevel lumbar and thoracic spondylosis.
IMPRESSION: 1. As on the most recent PET-CT exam, the patient terminated the PET
scan prior to full acquisition of data. Specifically, the pelvis and
upper thighs were excluded, and are not characterized on today's
PET-CT. Accordingly, although we have CT data for the entire region
of interest, we have had data only for the neck, chest, and upper
abdomen. In addition, the PET data in the lower head and neck in
particular is compromised by severe motion artifact. The patient was
talking and moving during the scan. This significantly degrades the
exam's diagnostic utility.
2. The right upper lobe mass is considerably increased in size, and
demonstrates an increase in maximum standard uptake value indicating
progression of disease. There is low-level but potentially abnormal
activity in several lower paratracheal and right hilar lymph nodes,
which are mildly enlarged.
3. Hypermetabolic activity along multiple anterior rib fractures.
Given the alignment of these rib abnormalities, I favor benign rib
fractures over pathologic fractures.
4. Infrarenal abdominal aortic aneurysm, 4.3 cm diameter, increased
from prior. Recommend followup by ultrasound in 1 year. This
recommendation follows ACR consensus guidelines: White Paper of the
ACR Incidental Findings Committee II on Vascular Findings. [HOSPITAL] 4719; [DATE].
5. Other imaging findings of potential clinical significance:
Chronic left maxillary sinusitis; suspected meningioma adjacent to
the right porus acousticus; emphysema; coronary, aortic arch, and
branch vessel atherosclerotic vascular disease. ; aortoiliac
atherosclerotic vascular disease. ; and sigmoid diverticulosis.

## 2017-04-22 IMAGING — CR DG CHEST 1V PORT
1 series · 1 of 1 positions shown · non-contrast
Comparison: Chest x-ray 01/24/2016.

CLINICAL DATA: 83-year-old female with shortness of breath and
respiratory distress since this morning.

EXAM:
PORTABLE CHEST 1 VIEW

[AP]
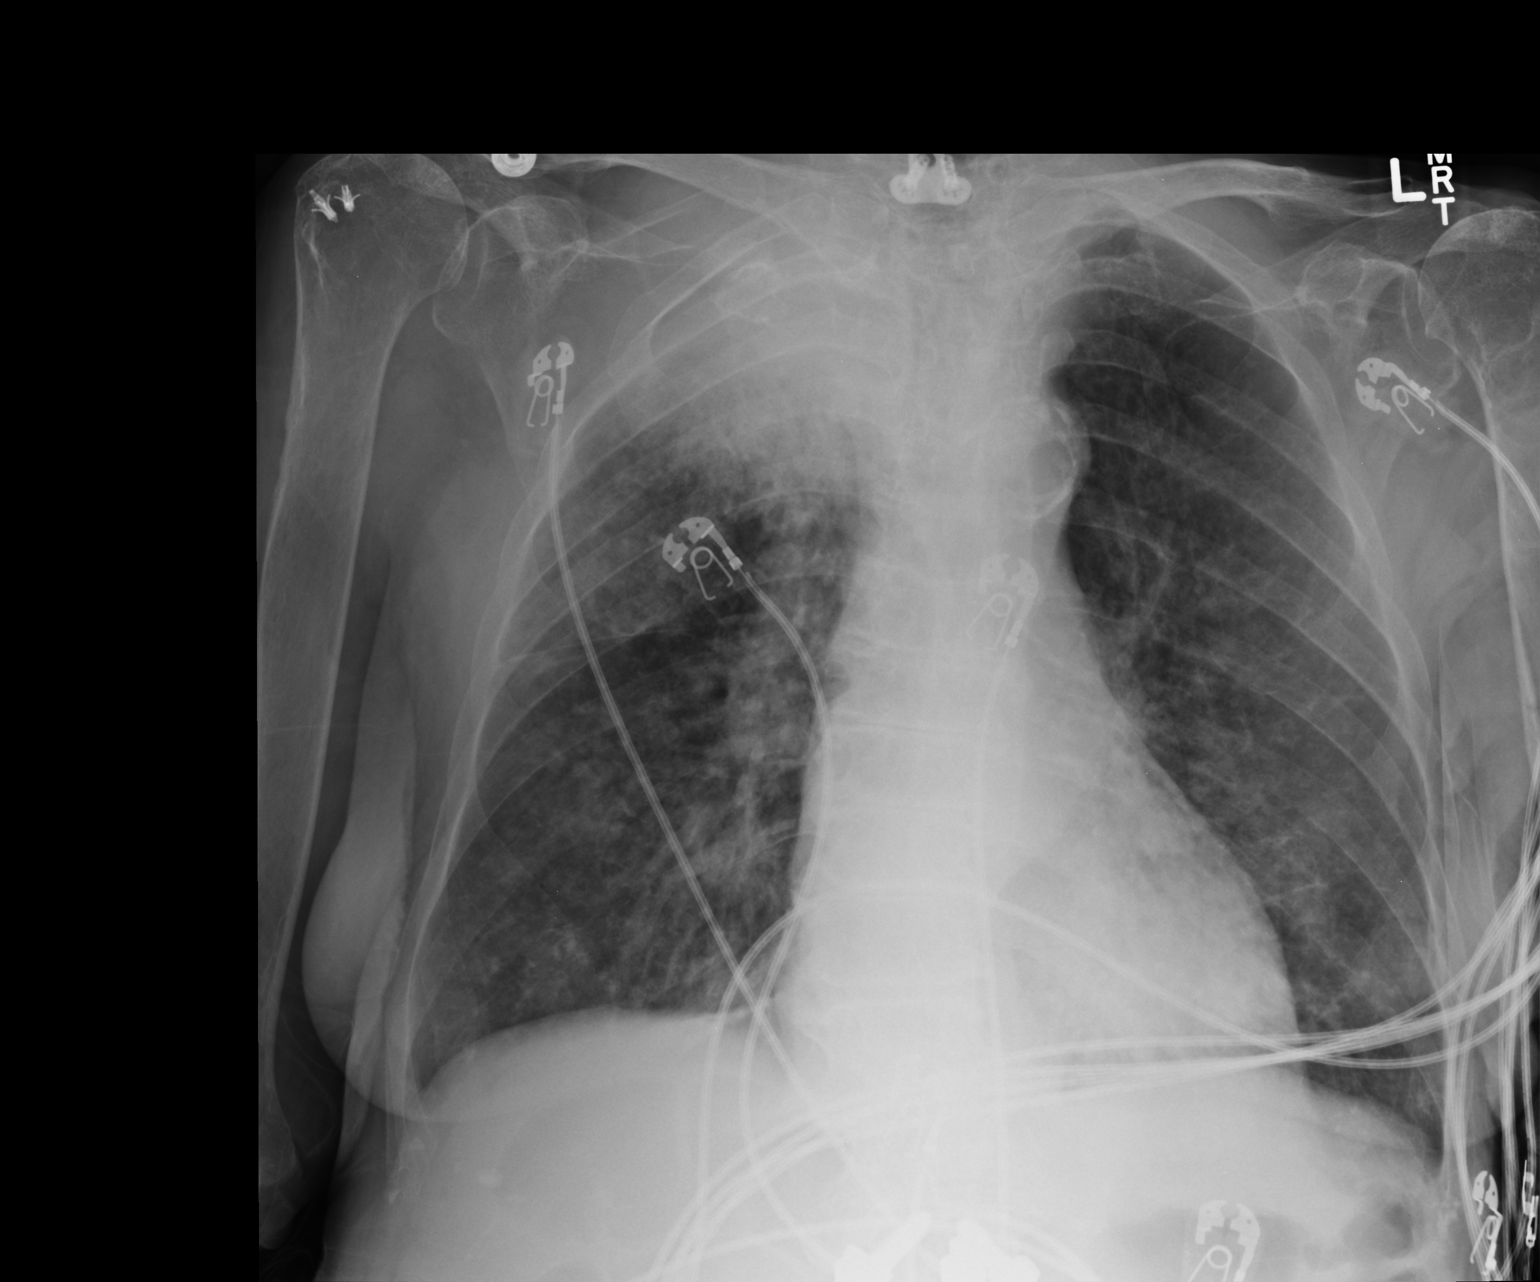

[1 of 1 positions shown; findings below may reference images not displayed]

FINDINGS: Again noted is a large mass in the apex of the right upper lobe
name, which appears similar to the recent prior examination, but is
clearly larger when compared to more remote prior chest x-ray is.
Findings are compatible with progression of malignancy, likely with
developing lymphangitic spread around a primary tumor. There is
prominent soft tissue in the right hilar region, suggesting
adenopathy. Mild diffuse peribronchial cuffing and patchy areas of
interstitial prominence are noted throughout the lungs bilaterally,
which could reflect additional areas of lymphangitic spread of
disease, or may reflect endobronchial spread of abnormal secretions
or blood products from the right upper lobe mass. No pleural
effusions. No evidence of pulmonary edema. Heart size is normal.
Atherosclerosis in the thoracic aorta.
IMPRESSION: 1. No significant change in the radiographic appearance the chest,
as discussed above, compatible with progression of right upper lobe
malignancy, with findings concerning for lymphangitic spread of
tumor in the right lung, with probable right hilar lymphadenopathy.
2. Atherosclerosis.
